# Patient Record
Sex: Female | Born: 1945 | Race: White | Hispanic: No | Marital: Married | Smoking: Never smoker
Health system: Southern US, Community
[De-identification: ages and names within clinical notes are randomized; demographics above are authoritative.]

## PROBLEM LIST (undated history)

## (undated) DIAGNOSIS — I499 Cardiac arrhythmia, unspecified: Secondary | ICD-10-CM

## (undated) DIAGNOSIS — E785 Hyperlipidemia, unspecified: Secondary | ICD-10-CM

## (undated) DIAGNOSIS — I350 Nonrheumatic aortic (valve) stenosis: Secondary | ICD-10-CM

## (undated) DIAGNOSIS — I351 Nonrheumatic aortic (valve) insufficiency: Secondary | ICD-10-CM

## (undated) DIAGNOSIS — R519 Headache, unspecified: Secondary | ICD-10-CM

## (undated) DIAGNOSIS — J302 Other seasonal allergic rhinitis: Secondary | ICD-10-CM

## (undated) DIAGNOSIS — I359 Nonrheumatic aortic valve disorder, unspecified: Secondary | ICD-10-CM

## (undated) HISTORY — DX: Hyperlipidemia, unspecified: E78.5

## (undated) HISTORY — DX: Nonrheumatic aortic (valve) stenosis: I35.0

## (undated) HISTORY — DX: Nonrheumatic aortic (valve) insufficiency: I35.1

---

## 2011-11-24 ENCOUNTER — Other Ambulatory Visit: Payer: Self-pay | Admitting: Family Medicine

## 2011-11-24 DIAGNOSIS — Z8271 Family history of polycystic kidney: Secondary | ICD-10-CM

## 2011-11-26 ENCOUNTER — Ambulatory Visit
Admission: RE | Admit: 2011-11-26 | Discharge: 2011-11-26 | Disposition: A | Discharge status: Home / Self Care | Payer: Medicare Other | Source: Ambulatory Visit | Attending: Family Medicine | Admitting: Family Medicine

## 2011-11-26 DIAGNOSIS — Z8271 Family history of polycystic kidney: Secondary | ICD-10-CM

## 2012-07-18 ENCOUNTER — Other Ambulatory Visit: Payer: Self-pay | Admitting: Family Medicine

## 2012-07-18 DIAGNOSIS — M8580 Other specified disorders of bone density and structure, unspecified site: Secondary | ICD-10-CM

## 2012-07-18 DIAGNOSIS — Z1231 Encounter for screening mammogram for malignant neoplasm of breast: Secondary | ICD-10-CM

## 2012-08-25 ENCOUNTER — Ambulatory Visit
Admission: RE | Admit: 2012-08-25 | Discharge: 2012-08-25 | Disposition: A | Discharge status: Home / Self Care | Payer: Medicare Other | Source: Ambulatory Visit | Attending: Family Medicine | Admitting: Family Medicine

## 2012-08-25 DIAGNOSIS — Z1231 Encounter for screening mammogram for malignant neoplasm of breast: Secondary | ICD-10-CM

## 2012-08-25 DIAGNOSIS — M8580 Other specified disorders of bone density and structure, unspecified site: Secondary | ICD-10-CM

## 2013-07-22 ENCOUNTER — Encounter (HOSPITAL_COMMUNITY): Payer: Self-pay | Admitting: Emergency Medicine

## 2013-07-22 ENCOUNTER — Emergency Department (HOSPITAL_COMMUNITY): Payer: Medicare Other

## 2013-07-22 ENCOUNTER — Emergency Department (HOSPITAL_COMMUNITY)
Admission: EM | Admit: 2013-07-22 | Discharge: 2013-07-22 | Disposition: A | Discharge status: Home / Self Care | Payer: Medicare Other | Attending: Emergency Medicine | Admitting: Emergency Medicine

## 2013-07-22 DIAGNOSIS — Z79899 Other long term (current) drug therapy: Secondary | ICD-10-CM | POA: Insufficient documentation

## 2013-07-22 DIAGNOSIS — R109 Unspecified abdominal pain: Secondary | ICD-10-CM

## 2013-07-22 DIAGNOSIS — Z8709 Personal history of other diseases of the respiratory system: Secondary | ICD-10-CM | POA: Insufficient documentation

## 2013-07-22 DIAGNOSIS — IMO0002 Reserved for concepts with insufficient information to code with codable children: Secondary | ICD-10-CM | POA: Insufficient documentation

## 2013-07-22 DIAGNOSIS — Z91040 Latex allergy status: Secondary | ICD-10-CM | POA: Insufficient documentation

## 2013-07-22 DIAGNOSIS — N390 Urinary tract infection, site not specified: Secondary | ICD-10-CM

## 2013-07-22 DIAGNOSIS — Z792 Long term (current) use of antibiotics: Secondary | ICD-10-CM | POA: Insufficient documentation

## 2013-07-22 DIAGNOSIS — R1013 Epigastric pain: Secondary | ICD-10-CM | POA: Insufficient documentation

## 2013-07-22 DIAGNOSIS — N281 Cyst of kidney, acquired: Secondary | ICD-10-CM | POA: Insufficient documentation

## 2013-07-22 HISTORY — DX: Other seasonal allergic rhinitis: J30.2

## 2013-07-22 LAB — CBC WITH DIFFERENTIAL/PLATELET
Basophils Absolute: 0 10*3/uL (ref 0.0–0.1)
Basophils Relative: 0 % (ref 0–1)
Eosinophils Absolute: 0.1 10*3/uL (ref 0.0–0.7)
Eosinophils Relative: 1 % (ref 0–5)
HCT: 36.9 % (ref 36.0–46.0)
Lymphocytes Relative: 21 % (ref 12–46)
MCH: 32.8 pg (ref 26.0–34.0)
MCHC: 35.8 g/dL (ref 30.0–36.0)
MCV: 91.8 fL (ref 78.0–100.0)
Monocytes Absolute: 0.6 10*3/uL (ref 0.1–1.0)
Monocytes Relative: 10 % (ref 3–12)
RDW: 12.6 % (ref 11.5–15.5)

## 2013-07-22 LAB — COMPREHENSIVE METABOLIC PANEL
AST: 16 U/L (ref 0–37)
Albumin: 3.8 g/dL (ref 3.5–5.2)
BUN: 12 mg/dL (ref 6–23)
CO2: 28 mEq/L (ref 19–32)
Calcium: 9.7 mg/dL (ref 8.4–10.5)
Creatinine, Ser: 0.89 mg/dL (ref 0.50–1.10)
GFR calc non Af Amer: 66 mL/min — ABNORMAL LOW (ref >90)
Sodium: 138 mEq/L (ref 135–145)

## 2013-07-22 LAB — URINALYSIS, ROUTINE W REFLEX MICROSCOPIC
Bilirubin Urine: NEGATIVE
Glucose, UA: NEGATIVE mg/dL
Ketones, ur: 15 mg/dL — AB
Nitrite: NEGATIVE
Protein, ur: NEGATIVE mg/dL
pH: 7 (ref 5.0–8.0)

## 2013-07-22 LAB — LIPASE, BLOOD: Lipase: 37 U/L (ref 11–59)

## 2013-07-22 LAB — URINE MICROSCOPIC-ADD ON

## 2013-07-22 MED ORDER — IOHEXOL 300 MG/ML  SOLN
100.0000 mL | Freq: Once | INTRAMUSCULAR | Status: AC | PRN
  Administered 2013-07-22: 100 mL via INTRAVENOUS

## 2013-07-22 MED ORDER — IOHEXOL 300 MG/ML  SOLN
25.0000 mL | INTRAMUSCULAR | Status: DC | PRN
  Administered 2013-07-22: 25 mL via ORAL

## 2013-07-22 MED ORDER — CIPROFLOXACIN HCL 500 MG PO TABS
500.0000 mg | ORAL_TABLET | Freq: Once | ORAL | Status: AC
  Administered 2013-07-22: 500 mg via ORAL
  Filled 2013-07-22: qty 1

## 2013-07-22 MED ORDER — HYDROCODONE-ACETAMINOPHEN 5-325 MG PO TABS: 2.0000 | ORAL_TABLET | ORAL | Status: DC | PRN

## 2013-07-22 MED ORDER — LEVOFLOXACIN 500 MG PO TABS: 500.0000 mg | ORAL_TABLET | Freq: Every day | ORAL | Status: DC

## 2013-07-22 MED ORDER — ONDANSETRON 4 MG PO TBDP: 4.0000 mg | ORAL_TABLET | Freq: Three times a day (TID) | ORAL | Status: DC | PRN

## 2013-07-22 NOTE — ED Provider Notes (Signed)
CSN: [id number]     Arrival date & time 07/22/13  1157 History   First MD Initiated Contact with Patient 07/22/13 1205     Chief Complaint  Patient presents with  . Abdominal Pain  . Fever    HPI  Patient presents with one to 2 days of abdominal pain. Symptoms started epigastric and right upper abdomen has a "discomfort" patient is hard time describing it. Not frankly periumbilical. She should not had a great appetite has not been nauseated. She's had normal bowel movements of diarrhea. Normally she avoids onions and spicy foods because they give her reflux. She does not have any fatty food or greasy food intolerance or typical biliary colic symptoms. Her pain was more to the right mid abdomen last night and this morning. [facility] and referred here for further evaluation. She has been eating. He admits her appetite is a bit off but does not have any frank anorexia. Has not had fevers or chills. Not had urinary symptoms. She has had no abdominal surgeries. She's not take anti-inflammatories. She does not smoke. She does not drink. She takes one or 2 cups of coffee per day.  She's had no weakness or fatigue. She's had no night sweats. She's had no weight loss or other B. type symptoms.  Past Medical History  Diagnosis Date  . Seasonal allergies    History reviewed. No pertinent past surgical history. History reviewed. No pertinent family history. History  Substance Use Topics  . Smoking status: Never Smoker   . Smokeless tobacco: Not on file  . Alcohol Use: Yes     Comment: wine occ   OB History   Grav Para Term Preterm Abortions TAB SAB Ect Mult Living                 Review of Systems  Allergies  Sulfa antibiotics; Bactrim; and Latex  Home Medications   Current Outpatient Rx  Name  Route  Sig  Dispense  Refill  . acetaminophen (TYLENOL) 500 MG tablet   Oral   Take 1,000 mg by mouth every 6 (six) hours as needed for pain.         . Ascorbic Acid (VITAMIN C  PO)   Oral   Take 1 tablet by mouth daily.         Marland Kitchen Bioflavonoid Products (GRAPE SEED PO)   Oral   Take 1 tablet by mouth daily.         Marland Kitchen CRANBERRY PO   Oral   Take 300 mg by mouth daily.         . fish oil-omega-3 fatty acids 1000 MG capsule   Oral   Take 1 g by mouth daily.         . fluticasone (FLONASE) 50 MCG/ACT nasal spray   Nasal   Place 2 sprays into the nose daily.         Marland Kitchen loratadine (CLARITIN) 10 MG tablet   Oral   Take 10 mg by mouth daily.         . Multiple Vitamin (MULTIVITAMIN WITH MINERALS) TABS tablet   Oral   Take 1 tablet by mouth daily.         Marland Kitchen HYDROcodone-acetaminophen (NORCO/VICODIN) 5-325 MG per tablet   Oral   Take 2 tablets by mouth every 4 (four) hours as needed for pain.   10 tablet   0   . levofloxacin (LEVAQUIN) 500 MG tablet   Oral   Take  1 tablet (500 mg total) by mouth daily.   10 tablet   0   . ondansetron (ZOFRAN ODT) 4 MG disintegrating tablet   Oral   Take 1 tablet (4 mg total) by mouth every 8 (eight) hours as needed for nausea.   20 tablet   0    BP 109/70  Pulse 80  Temp(Src) 99 F (37.2 C) (Oral)  Resp 16  Ht 5\' 3"  (1.6 m)  Wt 120 lb (54.432 kg)  BMI 21.26 kg/m2  SpO2 100% Physical Exam  Constitutional: She is oriented to person, place, and time. She appears well-developed and well-nourished. No distress.  HENT:  Head: Normocephalic.  Eyes: Conjunctivae are normal. Pupils are equal, round, and reactive to light. No scleral icterus.  Neck: Normal range of motion. Neck supple. No thyromegaly present.  Cardiovascular: Normal rate and regular rhythm.  Exam reveals no gallop and no friction rub.   No murmur heard. Pulmonary/Chest: Effort normal and breath sounds normal. No respiratory distress. She has no wheezes. She has no rales.  Abdominal: Soft. Bowel sounds are normal. She exhibits no distension. There is no tenderness. There is no rebound.  Slight tenderness to deep palpation of the right  mid abdomen. Not frankly tender over McBurney or the right pelvis. Some referred pain to the right midabdomen to palpation of the right upper quadrant. Negative Murphy's sign. No CVA area tenderness. Her exam Is not classic for appendicitis peritonitis or Murphy's tenderness.  Musculoskeletal: Normal range of motion.  Neurological: She is alert and oriented to person, place, and time.  Skin: Skin is warm and dry. No rash noted.  Psychiatric: She has a normal mood and affect. Her behavior is normal.    ED Course  Procedures (including critical care time) Labs Review Labs Reviewed  COMPREHENSIVE METABOLIC PANEL - Abnormal; Notable for the following:    GFR calc non Af Amer 66 (*)    GFR calc Af Amer 76 (*)    All other components within normal limits  URINALYSIS, ROUTINE W REFLEX MICROSCOPIC - Abnormal; Notable for the following:    Ketones, ur 15 (*)    Leukocytes, UA MODERATE (*)    All other components within normal limits  URINE MICROSCOPIC-ADD ON - Abnormal; Notable for the following:    Bacteria, UA MANY (*)    All other components within normal limits  URINE CULTURE  CBC WITH DIFFERENTIAL  LIPASE, BLOOD   Imaging Review Ct Abdomen Pelvis W Contrast  07/22/2013   CLINICAL DATA:  Initial encounter for right-sided abdominal pain that began 2 days ago, associated with fever.  EXAM: CT ABDOMEN AND PELVIS WITH CONTRAST  TECHNIQUE: Multidetector CT imaging of the abdomen and pelvis was performed using the standard protocol following bolus administration of intravenous contrast.  CONTRAST:  OMNIPAQUE IOHEXOL 300 MG/ML IV. Oral contrast was also administered.  COMPARISON:  None.  FINDINGS: Dilation of the common bile duct and common hepatic duct, maximum diameter approximating 11 mm. The duct can be followed to the ampulla where it tapers, and there is suggestion of a filling defect on both the axial and coronal images. No evidence of intrahepatic biliary ductal dilation.   Normal-appearing liver, spleen, pancreas, adrenal glands, and right kidney. Moderate aortoiliofemoral atherosclerosis without aneurysm. No significant lymphadenopathy.  Approximate 1 cm exophytic mass arising from the upper pole of the left kidney laterally which is not a simple cyst, having Hounsfield measurements in the 50-80 range. Second approximate 1 cm vague solid appearing  mass arising from the medial upper pole of the left kidney cyst. Subcentimeter more simple appearing cyst in the mid left kidney.  Normal appearing stomach and small bowel. Large stool burden throughout the tortuous, redundant, but otherwise normal-appearing colon. Normal appendix in the right upper pelvis, filling with the oral contrast material. No ascites.  Atrophic, slightly retroverted uterus containing a calcified fibroid in the posterior uterine body. No adnexal masses or free pelvic fluid. Numerous pelvic phleboliths. Urinary bladder decompressed, but demonstrates asymmetric thickening of the anterior, superior, and inferior walls relative to the posterior wall, associated with enhancement of the mucosa.  Bone window images demonstrate degenerative changes and ankylosis involving the right sacroiliac joint but are otherwise unremarkable. Visualized lung bases clear. Heart size upper normal.  IMPRESSION: 1. Dilation of the extrahepatic bile duct up to 11 mm diameter. The duct can be followed to the ampulla where there may be a small filling defect. 2. Two approximate 1 cm masses arising from the upper pole of the left kidney, 1 of which appears solid in the other of which is indeterminate. 3. Asymmetric thickening of the anterior, superior, and inferior walls of the urinary bladder, associated with enhancement of the bladder mucosa. Cystitis, either infectious or interstitial, is suspected.  In order to better evaluate the findings in the left kidney and in the distal common bile duct, MRI of the abdomen without and with contrast and  MRCP may be helpful in further evaluation. This recommendation follows ACR consensus guidelines: Managing Incidental Findings on Abdominal CT: White Paper of the ACR Incidental Findings Committee. J Am Coll Radiol 2010;7:754-773.   Electronically Signed   By: Hulan Saas M.D.   On: 07/22/2013 15:15    EKG Interpretation   None       MDM   1. Abdominal pain   2. Urinary tract infection   3. Renal cyst      With her pain starting more upper abdomen, then migrating to her right mid abdomen, I have concerns for appendicitis. However, the symptoms have progressed over 3 days, and her exam does not suggest peritonitis that I would expect with a 63-day-old appendicitis. She's not had typical gallbladder symptoms and is more tender in her mid abdomen than in the right subcostal abdomen. Differential diagnosis would include viral syndrome with adenitis, acid related phenomenon-duodenitis duodenal ulcer, appendicitis (atypical). Plan CT scan,urine and lab evaluation, and patient re-evaluation.  Urinalysis does show white blood cells and bacteria. Her hepatobiliary or pancreatic enzymes are normal. Her white blood cell count is also normal. CT scan shows abnormality the urinary latter lumen suggestive of a cystitis. Her CT scan also shows dilatation of her extrahepatic biliary ducts. And an abnormality and arising from the top of her left kidney. I've made aware of all these findings. I sat at the bedside and refer her to in pictures of her liver, bile bites, kidneys, and bladder. I explained all these abnormalities. Recommendation per the radiologist is followup MRI to further delineate the ampulla and bile ducts, and renal abnormalities.  I gave her pretty copies of her CT scan result. I gave her copies of her labs. I typed freehand my instructions for her to take these were primary care physician appointment. She has an appointment tomorrow at her primary care physician [facility]. I asked her to  take these with her to help set up the followup testing. I will start her on antibiotic treatment for her urinary tract infection. I feel confident that she will  followup as istruucted.       Roney Marion, MD 07/22/13 [id number]

## 2013-07-22 NOTE — ED Notes (Signed)
Discharge and follow up instructions reviewed with pt. Pt verbalized understanding.  

## 2013-07-22 NOTE — ED Notes (Signed)
Pt c/o rt sided abd pain that started on Friday. Pt states she also had a fever. Pt states [facility] sent her here for further evaluation, also told her she had a fever. Pt rates pain 6/10. Pt denies any nausea, vomiting, and diarrhea.

## 2013-07-22 NOTE — ED Notes (Signed)
Patient transported to CT 

## 2013-07-22 NOTE — ED Notes (Signed)
Pt reports right side abd pain x 1-2 days, having fever. Went to [facility] and sent here for further eval. Denies any vomiting or diarrhea.

## 2013-07-22 NOTE — ED Notes (Signed)
CT notified pt completed contrast.  

## 2013-07-23 ENCOUNTER — Other Ambulatory Visit: Payer: Self-pay | Admitting: Family Medicine

## 2013-07-23 DIAGNOSIS — R935 Abnormal findings on diagnostic imaging of other abdominal regions, including retroperitoneum: Secondary | ICD-10-CM

## 2013-07-23 DIAGNOSIS — N3289 Other specified disorders of bladder: Secondary | ICD-10-CM

## 2013-07-23 DIAGNOSIS — K838 Other specified diseases of biliary tract: Secondary | ICD-10-CM

## 2013-07-24 LAB — URINE CULTURE: Colony Count: >=100000

## 2013-07-26 NOTE — ED Notes (Signed)
+   Urine  No treatment needed per Tiffany Greene.  

## 2013-07-31 ENCOUNTER — Ambulatory Visit
Admission: RE | Admit: 2013-07-31 | Discharge: 2013-07-31 | Disposition: A | Discharge status: Home / Self Care | Payer: Medicare Other | Source: Ambulatory Visit | Attending: Family Medicine | Admitting: Family Medicine

## 2013-07-31 DIAGNOSIS — N3289 Other specified disorders of bladder: Secondary | ICD-10-CM

## 2013-07-31 DIAGNOSIS — R935 Abnormal findings on diagnostic imaging of other abdominal regions, including retroperitoneum: Secondary | ICD-10-CM

## 2013-07-31 DIAGNOSIS — K838 Other specified diseases of biliary tract: Secondary | ICD-10-CM

## 2013-08-08 ENCOUNTER — Ambulatory Visit
Admission: RE | Admit: 2013-08-08 | Discharge: 2013-08-08 | Disposition: A | Discharge status: Home / Self Care | Payer: Medicare Other | Source: Ambulatory Visit | Attending: Family Medicine | Admitting: Family Medicine

## 2013-08-08 MED ORDER — GADOBENATE DIMEGLUMINE 529 MG/ML IV SOLN
8.0000 mL | Freq: Once | INTRAVENOUS | Status: AC | PRN
  Administered 2013-08-08: 8 mL via INTRAVENOUS

## 2017-06-13 ENCOUNTER — Other Ambulatory Visit: Payer: Self-pay | Admitting: Family Medicine

## 2017-06-13 DIAGNOSIS — Z1231 Encounter for screening mammogram for malignant neoplasm of breast: Secondary | ICD-10-CM

## 2017-06-13 DIAGNOSIS — M8580 Other specified disorders of bone density and structure, unspecified site: Secondary | ICD-10-CM

## 2017-06-14 ENCOUNTER — Ambulatory Visit
Admission: RE | Admit: 2017-06-14 | Discharge: 2017-06-14 | Disposition: A | Discharge status: Home / Self Care | Payer: Medicare Other | Source: Ambulatory Visit | Attending: Family Medicine | Admitting: Family Medicine

## 2017-06-14 ENCOUNTER — Other Ambulatory Visit (HOSPITAL_COMMUNITY): Payer: Self-pay | Admitting: Psychiatry

## 2017-06-14 ENCOUNTER — Other Ambulatory Visit: Payer: Self-pay | Admitting: Family Medicine

## 2017-06-14 DIAGNOSIS — M25561 Pain in right knee: Secondary | ICD-10-CM

## 2017-06-14 DIAGNOSIS — R609 Edema, unspecified: Secondary | ICD-10-CM

## 2017-06-14 DIAGNOSIS — S8991XD Unspecified injury of right lower leg, subsequent encounter: Secondary | ICD-10-CM

## 2017-06-14 DIAGNOSIS — G8929 Other chronic pain: Secondary | ICD-10-CM

## 2017-07-06 ENCOUNTER — Ambulatory Visit
Admission: RE | Admit: 2017-07-06 | Discharge: 2017-07-06 | Disposition: A | Discharge status: Home / Self Care | Payer: Medicare Other | Source: Ambulatory Visit | Attending: Family Medicine | Admitting: Family Medicine

## 2017-07-06 DIAGNOSIS — Z1231 Encounter for screening mammogram for malignant neoplasm of breast: Secondary | ICD-10-CM

## 2017-07-06 DIAGNOSIS — M8580 Other specified disorders of bone density and structure, unspecified site: Secondary | ICD-10-CM

## 2018-03-14 ENCOUNTER — Encounter (HOSPITAL_COMMUNITY): Payer: Self-pay

## 2018-03-14 ENCOUNTER — Ambulatory Visit (HOSPITAL_COMMUNITY): Admit: 2018-03-14 | Payer: Medicare Other | Admitting: Cardiology

## 2018-03-14 SURGERY — ECHOCARDIOGRAM, TRANSESOPHAGEAL
Anesthesia: Moderate Sedation

## 2018-10-10 ENCOUNTER — Other Ambulatory Visit: Payer: Self-pay | Admitting: Cardiology

## 2018-10-10 DIAGNOSIS — I350 Nonrheumatic aortic (valve) stenosis: Secondary | ICD-10-CM

## 2018-10-23 ENCOUNTER — Other Ambulatory Visit (HOSPITAL_COMMUNITY): Payer: Self-pay | Admitting: Cardiology

## 2018-10-24 LAB — LIPID PANEL W/O CHOL/HDL RATIO
Cholesterol, Total: 173 mg/dL (ref 100–199)
HDL: 73 mg/dL (ref >39)
LDL CALC: 88 mg/dL (ref 0–99)
TRIGLYCERIDES: 62 mg/dL (ref 0–149)
VLDL CHOLESTEROL CAL: 12 mg/dL (ref 5–40)

## 2018-10-25 ENCOUNTER — Ambulatory Visit: Payer: Medicare Other

## 2018-10-25 DIAGNOSIS — I350 Nonrheumatic aortic (valve) stenosis: Secondary | ICD-10-CM

## 2018-11-03 ENCOUNTER — Ambulatory Visit (INDEPENDENT_AMBULATORY_CARE_PROVIDER_SITE_OTHER): Payer: Medicare Other | Admitting: Cardiology

## 2018-11-03 ENCOUNTER — Encounter: Payer: Self-pay | Admitting: Cardiology

## 2018-11-03 VITALS — BP 103/45 | HR 67 | Ht 63.0 in | Wt 126.9 lb

## 2018-11-03 DIAGNOSIS — I351 Nonrheumatic aortic (valve) insufficiency: Secondary | ICD-10-CM

## 2018-11-03 DIAGNOSIS — I350 Nonrheumatic aortic (valve) stenosis: Secondary | ICD-10-CM | POA: Diagnosis not present

## 2018-11-03 DIAGNOSIS — E782 Mixed hyperlipidemia: Secondary | ICD-10-CM

## 2018-11-03 NOTE — Progress Notes (Signed)
Patient is here for follow up visit.  Subjective:   @Patient  ID: Kayla Ward, female    DOB: 1946/03/22, 73 y.o.   MRN: [id number]  Chief Complaint  Patient presents with  . Aortic Insuffiency    HPI  73 year old Caucasian female with hyperlipidemia, moderate aortic stenosis and moderate aortic regurgitation.  Patient is doing better than a year ago in terms of exertional dyspnea. Echocardiogram in 10/2018 showed normal EF, moderate aortic regurgitation with moderate aortic stenosis, DVI of 0.30.  She has had only occasional episodes of lightheadedness, denies any syncope. Lipid panel in 10/2018 has showed significant improvement.   Past Medical History:  Diagnosis Date  . Aortic regurgitation   . AS (aortic stenosis)   . Hyperlipidemia   . Seasonal allergies     History reviewed. No pertinent surgical history.  Social History   Socioeconomic History  . Marital status: Married    Spouse name: Not on file  . Number of children: 2  . Years of education: Not on file  . Highest education level: Not on file  Occupational History  . Not on file  Social Needs  . Financial resource strain: Not on file  . Food insecurity:    Worry: Not on file    Inability: Not on file  . Transportation needs:    Medical: Not on file    Non-medical: Not on file  Tobacco Use  . Smoking status: Never Smoker  . Smokeless tobacco: Never Used  Substance and Sexual Activity  . Alcohol use: Yes    Comment: wine occ  . Drug use: No  . Sexual activity: Not on file  Lifestyle  . Physical activity:    Days per week: Not on file    Minutes per session: Not on file  . Stress: Not on file  Relationships  . Social connections:    Talks on phone: Not on file    Gets together: Not on file    Attends religious service: Not on file    Active member of club or organization: Not on file    Attends meetings of clubs or organizations: Not on file    Relationship status: Not on file  .  Intimate partner violence:    Fear of current or ex partner: Not on file    Emotionally abused: Not on file    Physically abused: Not on file    Forced sexual activity: Not on file  Other Topics Concern  . Not on file  Social History Narrative  . Not on file    Current Outpatient Medications on File Prior to Visit  Medication Sig Dispense Refill  . acetaminophen (TYLENOL) 500 MG tablet Take 1,000 mg by mouth every 6 (six) hours as needed for pain.    . Ascorbic Acid (VITAMIN C PO) Take 2,000 mg by mouth daily.     Marland Kitchen Bioflavonoid Products (GRAPE SEED PO) Take 400 mg by mouth daily.     . calcium acetate (PHOSLO) 667 MG capsule Take 1,300 mg by mouth daily.    . cetirizine (ZYRTEC) 10 MG tablet Take 10 mg by mouth continuous as needed for allergies.    Marland Kitchen diclofenac sodium (VOLTAREN) 1 % GEL Apply topically continuous as needed.    . fluticasone (FLONASE) 50 MCG/ACT nasal spray Place 2 sprays into the nose daily.    Marland Kitchen GLUCOSAMINE-CALCIUM-VIT D PO Take 1 tablet by mouth daily.    . Multiple Vitamin (MULTIVITAMIN WITH MINERALS) TABS tablet Take 1  tablet by mouth daily.    . ondansetron (ZOFRAN ODT) 4 MG disintegrating tablet Take 1 tablet (4 mg total) by mouth every 8 (eight) hours as needed for nausea. 20 tablet 0  . rosuvastatin (CRESTOR) 10 MG tablet Take 10 mg by mouth daily.    Marland Kitchen. CRANBERRY PO Take 300 mg by mouth daily.    . fish oil-omega-3 fatty acids 1000 MG capsule Take 1 g by mouth daily.    Marland Kitchen. HYDROcodone-acetaminophen (NORCO/VICODIN) 5-325 MG per tablet Take 2 tablets by mouth every 4 (four) hours as needed for pain. (Patient not taking: Reported on 11/03/2018) 10 tablet 0  . levofloxacin (LEVAQUIN) 500 MG tablet Take 1 tablet (500 mg total) by mouth daily. (Patient not taking: Reported on 11/03/2018) 10 tablet 0  . loratadine (CLARITIN) 10 MG tablet Take 10 mg by mouth daily.     No current facility-administered medications on file prior to visit.     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  Echocardiogram 10/25/2018: Left ventricle cavity is normal in size. Normal left ventricular shape. Normal global wall motion. Doppler evidence of grade I (impaired) diastolic dysfunction, normal LAP. Calculated EF 55%. Left atrial cavity is normal in size. 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 Mild calcification of the aortic valve annulus and leaflets. Moderate (Grade III) aortic regurgitation. Moderate aortic stenosis. Aortic valve mean gradient of 18 mmHg, Vmax of 2.7  m/s. Calculated aortic valve area by continuity equation is 1 cm. Dimensionless index 0.30 Mild to moderate tricuspid regurgitation. No evidence of pulmonary hypertension. Mo significant change compared to previous study dated 12/15/2017.    Results for Kayla CreaseBERKEY, Kayla (MRN [id number]) as of 11/03/2018 10:24  Ref. Range 10/23/2018 08:58/ 12/07/2017  Cholesterol, Total Latest Ref Range: 100 - 199 mg/dL 409173 / 811226  HDL Cholesterol Latest Ref Range: >39 mg/dL 73 / 75  LDL (calc) Latest Ref Range: 0 - 99 mg/dL 88 / 914151  Triglycerides Latest Ref Range: 0 - 149 mg/dL 62 / 782128  VLDL Cholesterol Cal Latest Ref Range: 5 - 40 mg/dL 12      Review of Systems  Constitution: Positive for malaise/fatigue. Negative for decreased appetite, weight gain and weight loss.  HENT: Negative for congestion.   Eyes: Negative for visual disturbance.  Cardiovascular: Positive for dyspnea on exertion (Improved compared to 2019.). Negative for chest pain, leg swelling, palpitations and syncope.  Respiratory: Negative for shortness of breath.   Endocrine: Negative for cold intolerance.  Hematologic/Lymphatic: Does not bruise/bleed easily.  Skin: Negative for itching and rash.  Musculoskeletal: Negative for myalgias.  Gastrointestinal: Negative for abdominal pain, nausea and vomiting.  Genitourinary: Negative for  dysuria.  Neurological: Negative for dizziness and weakness.  Psychiatric/Behavioral: The patient is not nervous/anxious.   All other systems reviewed and are negative.      Objective:   Vitals:   11/03/18 1012  BP: (!) 103/45  Pulse: 67  SpO2: 98%     Physical Exam  Constitutional: She is oriented to person, place, and time. She appears well-developed and well-nourished. No distress.  HENT:  Head: Normocephalic and atraumatic.  Eyes: Pupils are equal, round, and reactive to light. Conjunctivae are normal.  Neck: No JVD present.  Cardiovascular: Normal rate, regular rhythm and intact distal pulses.  Murmur (II/VI early systolic and diastolic murmur RUSB) heard. Pulmonary/Chest: Effort normal and breath sounds normal. She has no wheezes. She has no rales.  Abdominal: Soft. Bowel sounds are normal. There is no rebound.  Musculoskeletal:        General: No edema.  Lymphadenopathy:    She has no cervical adenopathy.  Neurological: She is alert and oriented to person, place, and time. No cranial nerve deficit.  Skin: Skin is warm and dry.  Psychiatric: She has a normal mood and affect.  Nursing note and vitals reviewed.       Assessment & Recommendations:   73 year old Caucasian female with hyperlipidemia, moderate aortic stenosis and moderate aortic regurgitation.  1. Moderate aortic regurgitation and stenosis: While the valve is structurally normal, there is moderate aortic regurgitation, thus causing relative increase in aortic valve velocities. Overall, her valvular findings still do not account for her dyspnea symptoms, which has improved. Recommend repeat echocardiogram and clinical follow up in 1 year.   2. Mixed hyperlipidemia Improved on statin. Continue the same.    Elder Negus, MD [facility] Pager: [phone] Office: [phone] If no answer Cell [phone]

## 2018-11-04 ENCOUNTER — Encounter: Payer: Self-pay | Admitting: Cardiology

## 2018-12-21 ENCOUNTER — Other Ambulatory Visit: Payer: Self-pay

## 2018-12-21 DIAGNOSIS — E782 Mixed hyperlipidemia: Secondary | ICD-10-CM

## 2018-12-21 MED ORDER — ROSUVASTATIN CALCIUM 10 MG PO TABS: 10.0000 mg | ORAL_TABLET | Freq: Every day | ORAL | 1 refills | Status: DC

## 2018-12-22 ENCOUNTER — Other Ambulatory Visit: Payer: Self-pay

## 2018-12-22 DIAGNOSIS — E782 Mixed hyperlipidemia: Secondary | ICD-10-CM

## 2018-12-22 MED ORDER — ROSUVASTATIN CALCIUM 10 MG PO TABS: 10.0000 mg | ORAL_TABLET | Freq: Every day | ORAL | 1 refills | Status: DC

## 2019-03-28 ENCOUNTER — Encounter (HOSPITAL_COMMUNITY): Payer: Self-pay

## 2019-03-28 ENCOUNTER — Emergency Department (HOSPITAL_COMMUNITY): Payer: Medicare Other

## 2019-03-28 ENCOUNTER — Emergency Department (HOSPITAL_COMMUNITY)
Admission: EM | Admit: 2019-03-28 | Discharge: 2019-03-28 | Disposition: A | Discharge status: Home / Self Care | Payer: Medicare Other | Attending: Emergency Medicine | Admitting: Emergency Medicine

## 2019-03-28 ENCOUNTER — Other Ambulatory Visit: Payer: Self-pay

## 2019-03-28 DIAGNOSIS — R55 Syncope and collapse: Secondary | ICD-10-CM | POA: Diagnosis present

## 2019-03-28 DIAGNOSIS — Z91040 Latex allergy status: Secondary | ICD-10-CM | POA: Diagnosis not present

## 2019-03-28 DIAGNOSIS — N390 Urinary tract infection, site not specified: Secondary | ICD-10-CM | POA: Diagnosis not present

## 2019-03-28 DIAGNOSIS — Z79899 Other long term (current) drug therapy: Secondary | ICD-10-CM | POA: Insufficient documentation

## 2019-03-28 LAB — CBC WITH DIFFERENTIAL/PLATELET
Abs Immature Granulocytes: 0.02 10*3/uL (ref 0.00–0.07)
Basophils Absolute: 0 10*3/uL (ref 0.0–0.1)
Basophils Relative: 0 %
Eosinophils Absolute: 0.1 10*3/uL (ref 0.0–0.5)
Eosinophils Relative: 1 %
HCT: 40.4 % (ref 36.0–46.0)
Hemoglobin: 13.8 g/dL (ref 12.0–15.0)
Immature Granulocytes: 0 %
Lymphocytes Relative: 10 %
Lymphs Abs: 0.7 10*3/uL (ref 0.7–4.0)
MCH: 31.9 pg (ref 26.0–34.0)
MCHC: 34.2 g/dL (ref 30.0–36.0)
MCV: 93.5 fL (ref 80.0–100.0)
Monocytes Absolute: 0.4 10*3/uL (ref 0.1–1.0)
Monocytes Relative: 6 %
Neutro Abs: 5.9 10*3/uL (ref 1.7–7.7)
Neutrophils Relative %: 83 %
Platelets: 182 10*3/uL (ref 150–400)
RBC: 4.32 MIL/uL (ref 3.87–5.11)
RDW: 12.2 % (ref 11.5–15.5)
WBC: 7.1 10*3/uL (ref 4.0–10.5)
nRBC: 0 % (ref 0.0–0.2)

## 2019-03-28 LAB — URINALYSIS, ROUTINE W REFLEX MICROSCOPIC
Bilirubin Urine: NEGATIVE
Glucose, UA: NEGATIVE mg/dL
Hgb urine dipstick: NEGATIVE
Ketones, ur: NEGATIVE mg/dL
Nitrite: NEGATIVE
Protein, ur: NEGATIVE mg/dL
Specific Gravity, Urine: 1.008 (ref 1.005–1.030)
WBC, UA: >50 WBC/hpf — ABNORMAL HIGH (ref 0–5)
pH: 9 — ABNORMAL HIGH (ref 5.0–8.0)

## 2019-03-28 LAB — BASIC METABOLIC PANEL
Anion gap: 11 (ref 5–15)
BUN: 7 mg/dL — ABNORMAL LOW (ref 8–23)
CO2: 27 mmol/L (ref 22–32)
Calcium: 9.8 mg/dL (ref 8.9–10.3)
Chloride: 104 mmol/L (ref 98–111)
Creatinine, Ser: 0.81 mg/dL (ref 0.44–1.00)
GFR calc Af Amer: >60 mL/min (ref >60)
GFR calc non Af Amer: >60 mL/min (ref >60)
Glucose, Bld: 100 mg/dL — ABNORMAL HIGH (ref 70–99)
Potassium: 4 mmol/L (ref 3.5–5.1)
Sodium: 142 mmol/L (ref 135–145)

## 2019-03-28 LAB — CBG MONITORING, ED: Glucose-Capillary: 87 mg/dL (ref 70–99)

## 2019-03-28 MED ORDER — SODIUM CHLORIDE 0.9 % IV BOLUS
500.0000 mL | Freq: Once | INTRAVENOUS | Status: AC
  Administered 2019-03-28: 500 mL via INTRAVENOUS

## 2019-03-28 MED ORDER — SODIUM CHLORIDE 0.9 % IV SOLN
1.0000 g | Freq: Once | INTRAVENOUS | Status: AC
  Administered 2019-03-28: 1 g via INTRAVENOUS
  Filled 2019-03-28: qty 10

## 2019-03-28 MED ORDER — CEPHALEXIN 500 MG PO CAPS: 500.0000 mg | ORAL_CAPSULE | Freq: Two times a day (BID) | ORAL | 0 refills | Status: AC

## 2019-03-28 NOTE — Discharge Instructions (Signed)
Today's evaluation was assuring. If symptoms begin to worsen please come back for a reevaluation. Patient is also being discharged with medications. Please take as directed.

## 2019-03-28 NOTE — ED Triage Notes (Signed)
Pt woke up & went into the bathroom, after using the restroom she had a syncable episode and did hit her head on the tile floor, her husband found her and called 911. Pt stated to wake up on the floor & upon arrival her only complaint is a slight headache in her forehead (per pt). A bump is felt from floor impact to the Lower-Rt posterior area of her head, A/Ox4.

## 2019-03-28 NOTE — ED Notes (Signed)
Pt is NSR on monitor 

## 2019-03-28 NOTE — ED Provider Notes (Signed)
[facility]   CSN: [id number] Arrival date & time: 03/28/19  [id number]     History   Chief Complaint Chief Complaint  Patient presents with  . Syncable Episode    HPI Kayla Ward is a 73 y.o. female.     Kayla Ward is a 73 y/o female who presents to the ED with a syncopal episode. The event happened this morning after Kayla Ward used the bathroom and washed her hands. Kayla Ward states that she does not remember the incident, but did feel nauseous and dizzy before her fall. She has a headache that radiates over the right occipital and temporal regions that is dull in character. The patient has complained of a severe headache last Monday (03/19/2019) for which she noticed color vision changes, photophobia, and large amounts of vomitus. She was also diagnosed with aortic stenosis and regurgitation  She also states that she has had increased urinary frequency and dysuria this week and has been waking up 3-4 times a night to use the bathroom. She does not complain of myalgias, fever, or abdominal pain.      Past Medical History:  Diagnosis Date  . Aortic regurgitation   . AS (aortic stenosis)   . Hyperlipidemia   . Seasonal allergies     There are no active problems to display for this patient.   History reviewed. No pertinent surgical history.   OB History   No obstetric history on file.      Home Medications    Prior to Admission medications   Medication Sig Start Date End Date Taking? Authorizing Provider  acetaminophen (TYLENOL) 500 MG tablet Take 1,000 mg by mouth every 6 (six) hours as needed for pain.    [provider]  Ascorbic Acid (VITAMIN C PO) Take 2,000 mg by mouth daily.     [provider]  Bioflavonoid Products (GRAPE SEED PO) Take 400 mg by mouth daily.     [provider]  calcium acetate (PHOSLO) 667 MG capsule Take 1,300 mg by mouth daily.    [provider]   carboxymethylcellulose (REFRESH PLUS) 0.5 % SOLN 1 drop daily as needed.    [provider]  cetirizine (ZYRTEC) 10 MG tablet Take 10 mg by mouth continuous as needed for allergies.    [provider]  CRANBERRY PO Take 300 mg by mouth daily.    [provider]  diclofenac sodium (VOLTAREN) 1 % GEL Apply topically continuous as needed.    [provider]  fish oil-omega-3 fatty acids 1000 MG capsule Take 1 g by mouth daily.    [provider]  fluticasone (FLONASE) 50 MCG/ACT nasal spray Place 2 sprays into the nose daily.    [provider]  GLUCOSAMINE-CALCIUM-VIT D PO Take 1 tablet by mouth daily.    [provider]  HYDROcodone-acetaminophen (NORCO/VICODIN) 5-325 MG per tablet Take 2 tablets by mouth every 4 (four) hours as needed for pain. Patient not taking: Reported on 11/03/2018 07/22/13   Rolland PorterJames, Mark, MD  levofloxacin (LEVAQUIN) 500 MG tablet Take 1 tablet (500 mg total) by mouth daily. Patient not taking: Reported on 11/03/2018 07/22/13   Rolland PorterJames, Mark, MD  loratadine (CLARITIN) 10 MG tablet Take 10 mg by mouth daily.    [provider]  Multiple Vitamin (MULTIVITAMIN WITH MINERALS) TABS tablet Take 1 tablet by mouth daily.    [provider]  ondansetron (ZOFRAN ODT) 4 MG disintegrating tablet Take 1 tablet (  4 mg total) by mouth every 8 (eight) hours as needed for nausea. 07/22/13   Rolland PorterJames, Mark, MD  rosuvastatin (CRESTOR) 10 MG tablet Take 1 tablet (10 mg total) by mouth daily. 12/22/18   Patwardhan, Anabel BeneManish J, MD    Family History History reviewed. No pertinent family history.  Social History Social History   Tobacco Use  . Smoking status: Never Smoker  . Smokeless tobacco: Never Used  Substance Use Topics  . Alcohol use: Yes    Comment: wine occ  . Drug use: No     Allergies   Bactrim [sulfamethoxazole-trimethoprim], Sulfa antibiotics, and Latex   Review of Systems Review of Systems   Constitutional: Negative for chills and fever.  HENT: Positive for rhinorrhea. Negative for ear pain and sore throat.   Eyes: Positive for photophobia. Negative for pain and visual disturbance.  Respiratory: Negative for cough, shortness of breath and stridor.   Cardiovascular: Negative for chest pain and palpitations.  Gastrointestinal: Negative for abdominal pain and vomiting.  Genitourinary: Positive for dysuria, frequency and urgency. Negative for hematuria.  Musculoskeletal: Negative for arthralgias and back pain.  Skin: Negative for color change and rash.  Neurological: Positive for dizziness and headaches. Negative for seizures and syncope.       Severe headache last week with vision changes, photophobia, and vomiting.   All other systems reviewed and are negative.    Physical Exam Updated Vital Signs BP (!) 184/74 (BP Location: Right Arm)   Pulse 71   Temp 98 F (36.7 C) (Oral)   Resp 16   Ht 5\' 3"  (1.6 m)   Wt 53.5 kg   SpO2 100%   BMI 20.90 kg/m   Physical Exam Vitals signs and nursing note reviewed.  Constitutional:      General: She is not in acute distress.    Appearance: Normal appearance. She is well-developed.  HENT:     Head: Normocephalic and atraumatic.  Eyes:     Extraocular Movements: Extraocular movements intact.     Conjunctiva/sclera: Conjunctivae normal.     Pupils: Pupils are equal, round, and reactive to light.  Neck:     Musculoskeletal: Neck supple.     Vascular: No carotid bruit.  Cardiovascular:     Rate and Rhythm: Normal rate and regular rhythm.  Pulmonary:     Effort: Pulmonary effort is normal. No respiratory distress.     Breath sounds: Normal breath sounds. No stridor. No wheezing or rhonchi.  Abdominal:     Palpations: Abdomen is soft.     Tenderness: There is no abdominal tenderness.  Skin:    General: Skin is warm and dry.     Coloration: Skin is not jaundiced or pale.  Neurological:     Mental Status: She is alert.       ED Treatments / Results  Labs (all labs ordered are listed, but only abnormal results are displayed) Labs Reviewed  URINALYSIS, ROUTINE W REFLEX MICROSCOPIC  CBC WITH DIFFERENTIAL/PLATELET  BASIC METABOLIC PANEL  CBG MONITORING, ED    EKG None  Radiology No results found.  Procedures Procedures (including critical care time)  Medications Ordered in ED Medications  sodium chloride 0.9 % bolus 500 mL (has no administration in time range)     Initial Impression / Assessment and Plan / ED Course  I have reviewed the triage vital signs and the nursing notes.  Pertinent labs & imaging results that were available during my care of the patient were reviewed by me  and considered in my medical decision making (see chart for details).   Assessment:  Kayla Ward is a 73 y/o female that presents to the ED with  syncopal and collapse.   Plan:  - CT Head W or Wo contrast: r/o bleed - BMP: r/o electrolyte abnormalities and assess renal function - Urinalysis, with reflex microscopic: r/o UTI - EKG: r/o arrhythmia - CBG: r/o hypoglycemia - CBC with differential: r/o infection  - Rocephin 1g IV: Treatment for UTI per Urinalysis and History      Final Clinical Impressions(s) / ED Diagnoses   Final diagnoses:  None    ED Discharge Orders    None       Maudie Mercury, MD 03/28/19 1241    Carmin Muskrat, MD 03/28/19 1259

## 2019-06-18 ENCOUNTER — Other Ambulatory Visit: Payer: Self-pay

## 2019-06-18 DIAGNOSIS — E782 Mixed hyperlipidemia: Secondary | ICD-10-CM

## 2019-06-18 MED ORDER — ROSUVASTATIN CALCIUM 10 MG PO TABS: 10.0000 mg | ORAL_TABLET | Freq: Every day | ORAL | 1 refills | Status: DC

## 2019-10-17 ENCOUNTER — Ambulatory Visit (INDEPENDENT_AMBULATORY_CARE_PROVIDER_SITE_OTHER): Payer: Medicare Other

## 2019-10-17 ENCOUNTER — Other Ambulatory Visit: Payer: Self-pay

## 2019-10-17 DIAGNOSIS — I351 Nonrheumatic aortic (valve) insufficiency: Secondary | ICD-10-CM | POA: Diagnosis not present

## 2019-10-24 ENCOUNTER — Other Ambulatory Visit: Payer: Medicare Other

## 2019-10-31 ENCOUNTER — Other Ambulatory Visit: Payer: Self-pay

## 2019-10-31 ENCOUNTER — Encounter: Payer: Self-pay | Admitting: Cardiology

## 2019-10-31 ENCOUNTER — Ambulatory Visit (INDEPENDENT_AMBULATORY_CARE_PROVIDER_SITE_OTHER): Payer: Medicare Other | Admitting: Cardiology

## 2019-10-31 VITALS — BP 114/76 | HR 78 | Temp 97.7°F | Ht 63.0 in | Wt 127.9 lb

## 2019-10-31 DIAGNOSIS — E782 Mixed hyperlipidemia: Secondary | ICD-10-CM | POA: Insufficient documentation

## 2019-10-31 DIAGNOSIS — I351 Nonrheumatic aortic (valve) insufficiency: Secondary | ICD-10-CM | POA: Insufficient documentation

## 2019-10-31 NOTE — Progress Notes (Signed)
Patient is here for follow up visit.  Subjective:   _0  ID: Lequita Halt, female    DOB: [birth year], 74 y.o.   MRN: [id number]  Chief Complaint  Patient presents with  . Aortic Stenosis  . Follow-up    1 year  . Results    echo    HPI  74 year old Caucasian female with hyperlipidemia, moderate aortic stenosis and moderate aortic regurgitation.  She is doing well. She has occasional episodes of exertional dyspnea, typically when she has sinus congestion. On other days, she does not have overt dyspnea. 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   In summer of 2020, she had an episode of UTI, along with any episode of syncope that was attributed to dehydration and infection. She has not had any recurrent episodes of this. She had CT head at that time, which was reportedly unremarkable.   Current Outpatient Medications on File Prior to Visit  Medication Sig Dispense Refill  . acetaminophen (TYLENOL) 500 MG tablet Take 1,000 mg by mouth every 6 (six) hours as needed for pain.    . Ascorbic Acid (VITAMIN C PO) Take 2,000 mg by mouth daily.     . calcium carbonate (OS-CAL) 1250 (500 Ca) MG chewable tablet Chew 1 tablet by mouth daily.    . cetirizine (ZYRTEC) 10 MG tablet Take 10 mg by mouth daily.     . diclofenac sodium (VOLTAREN) 1 % GEL Apply 2 g topically daily as needed (pain).     . fluticasone (FLONASE) 50 MCG/ACT nasal spray Place 2 sprays into the nose daily.    Marland Kitchen GLUCOSAMINE-CALCIUM-VIT D PO Take 1 tablet by mouth daily.    . Multiple Vitamin (MULTIVITAMIN WITH MINERALS) TABS tablet Take 1 tablet by mouth daily.    . rosuvastatin (CRESTOR) 10 MG tablet Take 1 tablet (10 mg total) by mouth daily. 90 tablet 1   No current facility-administered medications on file prior to visit.    Cardiovascular studies:  EKG 10/31/2019: Sinus rhythm 74 bpm. Normal EKG.  Echocardiogram 10/17/2019:  Normal LV systolic function with EF 59%. Left  ventricle cavity is normal  in size. Normal global wall motion. Normal diastolic filling pattern.  Moderate aortic valve leaflet thickening with moderate calcification.  Trileaflet aortic valve. Moderate aortic stenosis. Moderate (Grade III)  aortic regurgitation. Gradients elevated due to aortic regurgitation. AVA  (VTI) measures 0.9 cm^2. AV Mean Grad measures 20.6 mmHg. AV Pk Vel  measures 3 m/s.  Structurally normal mitral valve. Mild (Grade I) mitral regurgitation.  Mild to moderate tricuspid regurgitation. No evidence of pulmonary  hypertension.  No significant change from 10/25/2018.  EKG 11/03/2018: Sinus  Rhythm 63 bpm Possible old anteriot infarct.  Recent labs: 03/27/2020: Glucose 100, BUN/Cr 7/0.81. EGFR >60. Na/K 142/4.0.  H/H 13.8/40.4. MCV 93. Platelets 182  10/23/2018: Chol 173, TG 62, HDL 73, LDL 88   Review of Systems  Cardiovascular: Positive for dyspnea on exertion (Occasional) and syncope (In 03/2019). Negative for chest pain, leg swelling and palpitations.  Gastrointestinal: Negative for abdominal pain, nausea and vomiting.  Genitourinary: Positive for dysuria (In 03/2019).  All other systems reviewed and are negative.      Objective:   Vitals:   10/31/19 1004  BP: 114/76  Pulse: 78  Temp: 97.7 F (36.5 C)  SpO2: 99%     Physical Exam  HENT:  Head: Atraumatic.  Cardiovascular: Normal rate, regular rhythm and intact distal pulses.  Murmur (II/VI early  systolic and diastolic murmur RUSB) heard. Pulmonary/Chest: Effort normal and breath sounds normal. She has no wheezes. She has no rales.  Nursing note and vitals reviewed.       Assessment & Recommendations:   74 year old Caucasian female with hyperlipidemia, moderate aortic stenosis and moderate aortic regurgitation.  1. Moderate aortic regurgitation and stenosis: While the valve is structurally normal, there is moderate aortic regurgitation, thus causing relative increase in aortic valve  velocities. Overall, findings do not suggest severe stenosis or regurgitation. I do not think her syncope was related to her valvular heart disease. Repeat echocardiogram in 1 year  2. Mixed hyperlipidemia Improved on statin. Continue the same.  Check lipid panel now and repeat in 1 in year  F/u in 1 year   Nigel Mormon, MD [facility] [place] Pager: [phone] Office: [phone] If no answer Cell [phone]

## 2019-11-07 LAB — LIPID PANEL
Chol/HDL Ratio: 2.4 ratio (ref 0.0–4.4)
Cholesterol, Total: 169 mg/dL (ref 100–199)
HDL: 71 mg/dL (ref >39)
LDL Chol Calc (NIH): 83 mg/dL (ref 0–99)
Triglycerides: 80 mg/dL (ref 0–149)
VLDL Cholesterol Cal: 15 mg/dL (ref 5–40)

## 2019-11-07 NOTE — Progress Notes (Signed)
Pt aware of results.//ah

## 2019-12-17 ENCOUNTER — Other Ambulatory Visit: Payer: Self-pay

## 2019-12-17 DIAGNOSIS — E782 Mixed hyperlipidemia: Secondary | ICD-10-CM

## 2019-12-17 MED ORDER — ROSUVASTATIN CALCIUM 10 MG PO TABS: 10.0000 mg | ORAL_TABLET | Freq: Every day | ORAL | 1 refills | Status: DC

## 2020-03-18 ENCOUNTER — Other Ambulatory Visit: Payer: Self-pay | Admitting: Family Medicine

## 2020-03-18 DIAGNOSIS — Z1231 Encounter for screening mammogram for malignant neoplasm of breast: Secondary | ICD-10-CM

## 2020-03-18 DIAGNOSIS — M85852 Other specified disorders of bone density and structure, left thigh: Secondary | ICD-10-CM

## 2020-06-12 ENCOUNTER — Ambulatory Visit
Admission: RE | Admit: 2020-06-12 | Discharge: 2020-06-12 | Disposition: A | Discharge status: Home / Self Care | Payer: Medicare Other | Source: Ambulatory Visit | Attending: Family Medicine | Admitting: Family Medicine

## 2020-06-12 ENCOUNTER — Other Ambulatory Visit: Payer: Self-pay

## 2020-06-12 DIAGNOSIS — Z1231 Encounter for screening mammogram for malignant neoplasm of breast: Secondary | ICD-10-CM

## 2020-06-12 DIAGNOSIS — M85852 Other specified disorders of bone density and structure, left thigh: Secondary | ICD-10-CM

## 2020-06-17 ENCOUNTER — Other Ambulatory Visit: Payer: Self-pay | Admitting: Cardiology

## 2020-06-17 DIAGNOSIS — E782 Mixed hyperlipidemia: Secondary | ICD-10-CM

## 2020-10-03 LAB — LIPID PANEL
Chol/HDL Ratio: 2.1 ratio (ref 0.0–4.4)
Cholesterol, Total: 164 mg/dL (ref 100–199)
HDL: 77 mg/dL (ref >39)
LDL Chol Calc (NIH): 78 mg/dL (ref 0–99)
Triglycerides: 43 mg/dL (ref 0–149)
VLDL Cholesterol Cal: 9 mg/dL (ref 5–40)

## 2020-10-30 ENCOUNTER — Other Ambulatory Visit: Payer: Self-pay

## 2020-10-30 ENCOUNTER — Ambulatory Visit: Payer: Medicare Other

## 2020-10-30 DIAGNOSIS — I351 Nonrheumatic aortic (valve) insufficiency: Secondary | ICD-10-CM

## 2020-11-06 ENCOUNTER — Encounter: Payer: Self-pay | Admitting: Cardiology

## 2020-11-06 ENCOUNTER — Ambulatory Visit: Payer: Medicare Other | Admitting: Cardiology

## 2020-11-06 ENCOUNTER — Other Ambulatory Visit: Payer: Self-pay

## 2020-11-06 VITALS — BP 126/55 | HR 70 | Temp 98.4°F | Ht 63.0 in | Wt 131.0 lb

## 2020-11-06 DIAGNOSIS — I350 Nonrheumatic aortic (valve) stenosis: Secondary | ICD-10-CM | POA: Insufficient documentation

## 2020-11-06 DIAGNOSIS — E782 Mixed hyperlipidemia: Secondary | ICD-10-CM

## 2020-11-06 DIAGNOSIS — I351 Nonrheumatic aortic (valve) insufficiency: Secondary | ICD-10-CM

## 2020-11-06 DIAGNOSIS — R0989 Other specified symptoms and signs involving the circulatory and respiratory systems: Secondary | ICD-10-CM

## 2020-11-06 NOTE — Progress Notes (Signed)
Patient is here for follow up visit.  Subjective:   '@Patient'  ID: Kayla Ward, female    DOB: December 04, 1945, 75 y.o.   MRN: [id number]   Chief Complaint  Patient presents with  . Nonrheumatic aortic valve insufficiency  . Follow-up    HPI  75 year old Caucasian female with hyperlipidemia, moderate aortic stenosis and moderate aortic regurgitation.  In summer of 2020, she had an episode of UTI, along with any episode of syncope that was attributed to dehydration and infection. She has not had any recurrent episodes of this. She had CT head at that time, which was reportedly unremarkable.   Patient recently moved from one house to another. She walks around [facility] park, couple times, without any chest pain, shortness of breath, presyncope or syncope. She denies orthopnea, PND, leg edema. Sometimes, she feels like a "jolt sensation" only for a second or so/    Current Outpatient Medications on File Prior to Visit  Medication Sig Dispense Refill  . acetaminophen (TYLENOL) 500 MG tablet Take 1,000 mg by mouth every 6 (six) hours as needed for pain.    . Ascorbic Acid (VITAMIN C PO) Take 2,000 mg by mouth daily.     . calcium carbonate (OS-CAL) 1250 (500 Ca) MG chewable tablet Chew 1 tablet by mouth daily.    . cetirizine (ZYRTEC) 10 MG tablet Take 10 mg by mouth daily.     . diclofenac sodium (VOLTAREN) 1 % GEL Apply 2 g topically daily as needed (pain).     . fluticasone (FLONASE) 50 MCG/ACT nasal spray Place 2 sprays into the nose daily.    Marland Kitchen GLUCOSAMINE-CALCIUM-VIT D PO Take 1 tablet by mouth daily.    . Multiple Vitamin (MULTIVITAMIN WITH MINERALS) TABS tablet Take 1 tablet by mouth daily.    . rosuvastatin (CRESTOR) 10 MG tablet TAKE ONE TABLET BY MOUTH DAILY 90 tablet 1   No current facility-administered medications on file prior to visit.    Cardiovascular studies:  EKG 11/06/2020: Sinus rhythm 65 bpm Normal EKG  Echocardiogram 10/30/2020:  Left ventricle cavity is  normal in size and wall thickness. Normal global  wall motion. Normal LV systolic function with EF 55%. Doppler evidence of  grade I (impaired) diastolic dysfunction, normal LAP.  Left atrial cavity is mildly dilated.  Trileaflet aortic valve with moderate calcification. Moderate (grade III)  aortic regurgitation. While gradients are likely elevated due to aortic  regurgitation, there is at least moderate aortic stenosis. Vmax 3.3 m/sec,  mean PG 29 mmHg, AVA 0.8 cm2 by continuity equation. Dimensionless index  0.27.  Mild to moderate mitral regurgitation.  Mild tricuspid regurgitation.  No evidence of pulmonary hypertension.  Compared to previous study on 10/17/2019, aortic valve mean PG has  increased from 20 mmHg to 29 mmHg, suggesting progression of aortic  stenosis severity.   EKG 10/31/2019: Sinus rhythm 74 bpm. Normal EKG.  Recent labs: 10/02/2020: Chol 164, TG 43, HDL 77, LDL 78  03/27/2020: Glucose 100, BUN/Cr 7/0.81. EGFR >60. Na/K 142/4.0.  H/H 13.8/40.4. MCV 93. Platelets 182  10/23/2018: Chol 173, TG 62, HDL 73, LDL 88   Review of Systems  Cardiovascular: Negative for chest pain, dyspnea on exertion, leg swelling, palpitations and syncope.  Gastrointestinal: Negative for abdominal pain, nausea and vomiting.  Genitourinary: Positive for dysuria (In 03/2019).  All other systems reviewed and are negative.      Objective:    Vitals:   11/06/20 1334  BP: (!) 126/55  Pulse: 70  Temp:  98.4 F (36.9 C)  SpO2: 98%     Physical Exam Vitals and nursing note reviewed.  HENT:     Head: Atraumatic.  Cardiovascular:     Rate and Rhythm: Normal rate and regular rhythm.     Pulses: Intact distal pulses.     Heart sounds: Murmur heard.   Harsh midsystolic murmur is present with a grade of 2/6 at the upper right sternal border radiating to the neck. High-pitched blowing decrescendo early diastolic murmur is present with a grade of 2/4 at the upper right sternal border  radiating to the apex.   Pulmonary:     Effort: Pulmonary effort is normal.     Breath sounds: Normal breath sounds. No wheezing or rales.         Assessment & Recommendations:   75 year old Caucasian female with hyperlipidemia, moderate aortic stenosis and moderate aortic regurgitation.  Moderate aortic regurgitation and stenosis: There is moderate grade 3. There is progression of aortic stenosis from mild to moderate. She is currently asymptomatic from this. Given the recent change, recommend repeat echocardiogram in 6 months. I have encouraged patient to look for any symptoms such as exertional dyspnea, chest pain, presyncope or syncope, orthopnea, PND, leg edema. I do not think there is any indication for aortic valve placement at this time, but clearly needs close monitoring.  Carotid bruit:  Most likely conducted murmur. Will obtain carotid ultrasound to rule out any significant disease.   Mixed hyperlipidemia Improved on statin. Continue the same.   F/u in 6 months  Kayla Cammarata [name], MD [facility] [place] Pager: [phone] Office: [phone] If no answer Cell [phone]

## 2020-11-19 ENCOUNTER — Other Ambulatory Visit: Payer: Self-pay

## 2020-11-19 ENCOUNTER — Ambulatory Visit: Payer: Medicare Other

## 2020-11-19 ENCOUNTER — Other Ambulatory Visit: Payer: Medicare Other

## 2020-11-19 DIAGNOSIS — R0989 Other specified symptoms and signs involving the circulatory and respiratory systems: Secondary | ICD-10-CM

## 2020-11-24 NOTE — Progress Notes (Signed)
Called and spoke with patient regarding her CAD results.

## 2020-12-12 ENCOUNTER — Other Ambulatory Visit: Payer: Self-pay

## 2020-12-12 DIAGNOSIS — E782 Mixed hyperlipidemia: Secondary | ICD-10-CM

## 2020-12-12 MED ORDER — ROSUVASTATIN CALCIUM 10 MG PO TABS: 10.0000 mg | ORAL_TABLET | Freq: Every day | ORAL | 3 refills | Status: DC

## 2021-04-08 ENCOUNTER — Other Ambulatory Visit: Payer: Self-pay

## 2021-04-08 ENCOUNTER — Emergency Department (HOSPITAL_BASED_OUTPATIENT_CLINIC_OR_DEPARTMENT_OTHER)
Admission: EM | Admit: 2021-04-08 | Discharge: 2021-04-08 | Disposition: A | Discharge status: Home / Self Care | Payer: Medicare Other | Attending: Emergency Medicine | Admitting: Emergency Medicine

## 2021-04-08 ENCOUNTER — Encounter (HOSPITAL_BASED_OUTPATIENT_CLINIC_OR_DEPARTMENT_OTHER): Payer: Self-pay

## 2021-04-08 ENCOUNTER — Emergency Department (HOSPITAL_BASED_OUTPATIENT_CLINIC_OR_DEPARTMENT_OTHER): Payer: Medicare Other

## 2021-04-08 DIAGNOSIS — R55 Syncope and collapse: Secondary | ICD-10-CM | POA: Diagnosis present

## 2021-04-08 DIAGNOSIS — R1013 Epigastric pain: Secondary | ICD-10-CM | POA: Diagnosis not present

## 2021-04-08 DIAGNOSIS — Z91040 Latex allergy status: Secondary | ICD-10-CM | POA: Insufficient documentation

## 2021-04-08 DIAGNOSIS — Z20822 Contact with and (suspected) exposure to covid-19: Secondary | ICD-10-CM | POA: Diagnosis not present

## 2021-04-08 DIAGNOSIS — R110 Nausea: Secondary | ICD-10-CM | POA: Diagnosis not present

## 2021-04-08 DIAGNOSIS — R5383 Other fatigue: Secondary | ICD-10-CM | POA: Insufficient documentation

## 2021-04-08 DIAGNOSIS — N3000 Acute cystitis without hematuria: Secondary | ICD-10-CM | POA: Diagnosis not present

## 2021-04-08 DIAGNOSIS — R42 Dizziness and giddiness: Secondary | ICD-10-CM | POA: Insufficient documentation

## 2021-04-08 DIAGNOSIS — R0789 Other chest pain: Secondary | ICD-10-CM | POA: Diagnosis not present

## 2021-04-08 LAB — RESP PANEL BY RT-PCR (FLU A&B, COVID) ARPGX2
Influenza A by PCR: NEGATIVE
Influenza B by PCR: NEGATIVE
SARS Coronavirus 2 by RT PCR: NEGATIVE

## 2021-04-08 LAB — COMPREHENSIVE METABOLIC PANEL
ALT: 10 U/L (ref 0–44)
AST: 15 U/L (ref 15–41)
Albumin: 4.2 g/dL (ref 3.5–5.0)
Alkaline Phosphatase: 52 U/L (ref 38–126)
Anion gap: 7 (ref 5–15)
BUN: 8 mg/dL (ref 8–23)
CO2: 28 mmol/L (ref 22–32)
Calcium: 9.1 mg/dL (ref 8.9–10.3)
Chloride: 104 mmol/L (ref 98–111)
Creatinine, Ser: 0.83 mg/dL (ref 0.44–1.00)
GFR, Estimated: >60 mL/min (ref >60)
Glucose, Bld: 110 mg/dL — ABNORMAL HIGH (ref 70–99)
Potassium: 4.2 mmol/L (ref 3.5–5.1)
Sodium: 139 mmol/L (ref 135–145)
Total Bilirubin: 0.8 mg/dL (ref 0.3–1.2)
Total Protein: 6.7 g/dL (ref 6.5–8.1)

## 2021-04-08 LAB — CBC WITH DIFFERENTIAL/PLATELET
Abs Immature Granulocytes: 0.05 10*3/uL (ref 0.00–0.07)
Basophils Absolute: 0 10*3/uL (ref 0.0–0.1)
Basophils Relative: 0 %
Eosinophils Absolute: 0 10*3/uL (ref 0.0–0.5)
Eosinophils Relative: 0 %
HCT: 37.6 % (ref 36.0–46.0)
Hemoglobin: 12.8 g/dL (ref 12.0–15.0)
Immature Granulocytes: 0 %
Lymphocytes Relative: 8 %
Lymphs Abs: 0.9 10*3/uL (ref 0.7–4.0)
MCH: 31.3 pg (ref 26.0–34.0)
MCHC: 34 g/dL (ref 30.0–36.0)
MCV: 91.9 fL (ref 80.0–100.0)
Monocytes Absolute: 0.9 10*3/uL (ref 0.1–1.0)
Monocytes Relative: 8 %
Neutro Abs: 9.3 10*3/uL — ABNORMAL HIGH (ref 1.7–7.7)
Neutrophils Relative %: 84 %
Platelets: 158 10*3/uL (ref 150–400)
RBC: 4.09 MIL/uL (ref 3.87–5.11)
RDW: 12.6 % (ref 11.5–15.5)
WBC: 11.2 10*3/uL — ABNORMAL HIGH (ref 4.0–10.5)
nRBC: 0 % (ref 0.0–0.2)

## 2021-04-08 LAB — TROPONIN I (HIGH SENSITIVITY)
Troponin I (High Sensitivity): 2 ng/L (ref <18)
Troponin I (High Sensitivity): 3 ng/L (ref <18)

## 2021-04-08 LAB — URINALYSIS, ROUTINE W REFLEX MICROSCOPIC
Bilirubin Urine: NEGATIVE
Glucose, UA: NEGATIVE mg/dL
Hgb urine dipstick: NEGATIVE
Nitrite: NEGATIVE
Protein, ur: NEGATIVE mg/dL
Specific Gravity, Urine: 1.028 (ref 1.005–1.030)
pH: 7.5 (ref 5.0–8.0)

## 2021-04-08 LAB — LIPASE, BLOOD: Lipase: 32 U/L (ref 11–51)

## 2021-04-08 LAB — LACTIC ACID, PLASMA: Lactic Acid, Venous: 0.9 mmol/L (ref 0.5–1.9)

## 2021-04-08 MED ORDER — CEPHALEXIN 500 MG PO CAPS: 500.0000 mg | ORAL_CAPSULE | Freq: Three times a day (TID) | ORAL | 0 refills | Status: AC

## 2021-04-08 MED ORDER — LIDOCAINE-EPINEPHRINE (PF) 2 %-1:200000 IJ SOLN
10.0000 mL | Freq: Once | INTRAMUSCULAR | Status: AC
  Administered 2021-04-08: 10 mL via INTRADERMAL
  Filled 2021-04-08: qty 20

## 2021-04-08 MED ORDER — IOHEXOL 350 MG/ML SOLN
80.0000 mL | Freq: Once | INTRAVENOUS | Status: AC | PRN
  Administered 2021-04-08: 80 mL via INTRAVENOUS

## 2021-04-08 MED ORDER — SODIUM CHLORIDE 0.9 % IV BOLUS
1000.0000 mL | Freq: Once | INTRAVENOUS | Status: AC
  Administered 2021-04-08: 1000 mL via INTRAVENOUS

## 2021-04-08 NOTE — ED Notes (Signed)
Urine obtained and sent to lab  

## 2021-04-08 NOTE — ED Provider Notes (Signed)
[facility] EMERGENCY DEPT Provider Note   CSN: [id number] Arrival date & time: 04/08/21  1093     History Chief Complaint  Patient presents with   Kayla Ward    Kayla Ward is a 75 y.o. female.  HPI     Yesterday saw Dr., had regular blood work and pneumonia shot  Thought had UTI yesterday but has not had results, since Friday having dysuria and frequent urination Woke up this AM, felt fatigued and groggy Then got dressed and felt sort of tired, dizzy, sat on tub, then felt significant nausea and waves of pain through upper stomach/epigastric area, lower chest pain, pain and nausea combination Then passed out and woke up on the floor, not out very long, husband came in Has had intermittent dark stool over months, none today 68yrs ago had syncopal episode In Feb had syncope as well during ECHO Tired  Past Medical History:  Diagnosis Date   Aortic regurgitation    AS (aortic stenosis)    Hyperlipidemia    Seasonal allergies     Patient Active Problem List   Diagnosis Date Noted   Nonrheumatic aortic valve stenosis 11/06/2020   Nonrheumatic aortic valve insufficiency 10/31/2019   Mixed hyperlipidemia 10/31/2019    History reviewed. No pertinent surgical history.   OB History   No obstetric history on file.     Family History  Problem Relation Age of Onset   Cancer Mother    Cancer Father    Heart disease Father    Heart murmur Sister     Social History   Tobacco Use   Smoking status: Never   Smokeless tobacco: Never  Vaping Use   Vaping Use: Never used  Substance Use Topics   Alcohol use: Yes    Comment: wine occ   Drug use: No    Home Medications Prior to Admission medications   Medication Sig Start Date End Date Taking? Authorizing Provider  acetaminophen (TYLENOL) 500 MG tablet Take 1,000 mg by mouth every 6 (six) hours as needed for pain.   Yes [provider]  Ascorbic Acid (VITAMIN C PO) Take 2,000 mg by mouth daily.     Yes [provider]  calcium carbonate (OS-CAL) 1250 (500 Ca) MG chewable tablet Chew 1 tablet by mouth daily.   Yes [provider]  cephALEXin (KEFLEX) 500 MG capsule Take 1 capsule (500 mg total) by mouth 3 (three) times daily for 10 days. 04/08/21 04/18/21 Yes Alvira Monday, MD  cetirizine (ZYRTEC) 10 MG tablet Take 10 mg by mouth daily.   Yes [provider]  diclofenac sodium (VOLTAREN) 1 % GEL Apply 2 g topically daily as needed (pain).    Yes [provider]  fluticasone (FLONASE) 50 MCG/ACT nasal spray Place 2 sprays into the nose daily.   Yes [provider]  Multiple Vitamin (MULTIVITAMIN WITH MINERALS) TABS tablet Take 1 tablet by mouth daily.   Yes [provider]  rosuvastatin (CRESTOR) 10 MG tablet Take 1 tablet (10 mg total) by mouth daily. 12/12/20  Yes Patwardhan, Manish J, MD    Allergies    Bactrim [sulfamethoxazole-trimethoprim], Sulfa antibiotics, and Latex  Review of Systems   Review of Systems  Constitutional:  Positive for activity change, appetite change and fatigue.  Respiratory:  Negative for cough and shortness of breath.   Cardiovascular:  Positive for chest pain.  Gastrointestinal:  Positive for abdominal pain and nausea. Negative for diarrhea and vomiting. Blood in stool: black stool. Genitourinary:  Positive for dysuria and frequency.  Musculoskeletal:  Negative for back pain.  Skin:  Negative for rash.  Neurological:  Positive for syncope and light-headedness. Negative for weakness, numbness and headaches.   Physical Exam Updated Vital Signs BP (!) 112/56   Pulse 79   Temp 98.9 F (37.2 C) (Oral)   Resp 15   SpO2 97%   Physical Exam Vitals and nursing note reviewed.  Constitutional:      General: She is not in acute distress.    Appearance: She is well-developed. She is not diaphoretic.  HENT:     Head: Normocephalic and atraumatic.  Eyes:     Conjunctiva/sclera: Conjunctivae normal.   Cardiovascular:     Rate and Rhythm: Normal rate and regular rhythm.     Heart sounds: Normal heart sounds. No murmur heard.   No friction rub. No gallop.  Pulmonary:     Effort: Pulmonary effort is normal. No respiratory distress.     Breath sounds: Normal breath sounds. No wheezing or rales.  Abdominal:     General: There is no distension.     Palpations: Abdomen is soft.     Tenderness: There is abdominal tenderness (epigastric). There is no guarding.  Musculoskeletal:        General: No tenderness.     Cervical back: Normal range of motion.  Skin:    General: Skin is warm and dry.     Findings: No erythema or rash.  Neurological:     Mental Status: She is alert and oriented to person, place, and time.    ED Results / Procedures / Treatments   Labs (all labs ordered are listed, but only abnormal results are displayed) Labs Reviewed  CBC WITH DIFFERENTIAL/PLATELET - Abnormal; Notable for the following components:      Result Value   WBC 11.2 (*)    Neutro Abs 9.3 (*)    All other components within normal limits  COMPREHENSIVE METABOLIC PANEL - Abnormal; Notable for the following components:   Glucose, Bld 110 (*)    All other components within normal limits  URINALYSIS, ROUTINE W REFLEX MICROSCOPIC - Abnormal; Notable for the following components:   Color, Urine COLORLESS (*)    Ketones, ur TRACE (*)    Leukocytes,Ua MODERATE (*)    Bacteria, UA FEW (*)    All other components within normal limits  RESP PANEL BY RT-PCR (FLU A&B, COVID) [id number]  URINE CULTURE  LIPASE, BLOOD  LACTIC ACID, PLASMA  TROPONIN I (HIGH SENSITIVITY)  TROPONIN I (HIGH SENSITIVITY)    EKG EKG Interpretation  Date/Time:  Wednesday April 08 2021 08:33:08 EDT Ventricular Rate:  82 PR Interval:  97 QRS Duration: 77 QT Interval:  366 QTC Calculation: 428 R Axis:   72 Text Interpretation: Sinus rhythm Short PR interval Borderline repolarization abnormality No significant change since last  tracing Confirmed by Alvira MondaySchlossman, Agusta Hackenberg ([id number]) on 04/08/2021 8:51:50 AM  Radiology CT Head Wo Contrast  Result Date: 04/08/2021 CLINICAL DATA:  Patient fell this morning in the bathroom after syncope. Struck head on floor with posterior scalp laceration. EXAM: CT HEAD WITHOUT CONTRAST CT CERVICAL SPINE WITHOUT CONTRAST TECHNIQUE: Multidetector CT imaging of the head and cervical spine was performed following the standard protocol without intravenous contrast. Multiplanar CT image reconstructions of the cervical spine were also generated. COMPARISON:  Head CT 03/28/2019 FINDINGS: CT HEAD FINDINGS Brain: There is no evidence for acute hemorrhage, hydrocephalus, mass lesion, or abnormal extra-axial fluid collection. No definite CT evidence for  acute infarction. Vascular: No hyperdense vessel or unexpected calcification. Skull: No evidence for fracture. No worrisome lytic or sclerotic lesion. Sinuses/Orbits: The visualized paranasal sinuses and mastoid air cells are clear. Visualized portions of the globes and intraorbital fat are unremarkable. Other: None. CT CERVICAL SPINE FINDINGS Alignment: Normal. Skull base and vertebrae: No acute fracture. No primary bone lesion or focal pathologic process. Soft tissues and spinal canal: No prevertebral fluid or swelling. No visible canal hematoma. Disc levels: Loss of disc height with endplate degeneration noted C4-5 into a more mild degree at C5-6 and C6-7. Upper chest: Unremarkable. Other: None. IMPRESSION: 1. Stable head CT.  No acute intracranial abnormality. 2. Unremarkable CT cervical spine.  No evidence of fracture. Electronically Signed   By: Kennith Center M.D.   On: 04/08/2021 11:00   CT Cervical Spine Wo Contrast  Result Date: 04/08/2021 CLINICAL DATA:  Patient fell this morning in the bathroom after syncope. Struck head on floor with posterior scalp laceration. EXAM: CT HEAD WITHOUT CONTRAST CT CERVICAL SPINE WITHOUT CONTRAST TECHNIQUE: Multidetector CT imaging  of the head and cervical spine was performed following the standard protocol without intravenous contrast. Multiplanar CT image reconstructions of the cervical spine were also generated. COMPARISON:  Head CT 03/28/2019 FINDINGS: CT HEAD FINDINGS Brain: There is no evidence for acute hemorrhage, hydrocephalus, mass lesion, or abnormal extra-axial fluid collection. No definite CT evidence for acute infarction. Vascular: No hyperdense vessel or unexpected calcification. Skull: No evidence for fracture. No worrisome lytic or sclerotic lesion. Sinuses/Orbits: The visualized paranasal sinuses and mastoid air cells are clear. Visualized portions of the globes and intraorbital fat are unremarkable. Other: None. CT CERVICAL SPINE FINDINGS Alignment: Normal. Skull base and vertebrae: No acute fracture. No primary bone lesion or focal pathologic process. Soft tissues and spinal canal: No prevertebral fluid or swelling. No visible canal hematoma. Disc levels: Loss of disc height with endplate degeneration noted C4-5 into a more mild degree at C5-6 and C6-7. Upper chest: Unremarkable. Other: None. IMPRESSION: 1. Stable head CT.  No acute intracranial abnormality. 2. Unremarkable CT cervical spine.  No evidence of fracture. Electronically Signed   By: Kennith Center M.D.   On: 04/08/2021 11:00   CT Angio Chest/Abd/Pel for Dissection W and/or Wo Contrast  Result Date: 04/08/2021 CLINICAL DATA:  Chest and abdominal pain.  Suspect dissection. EXAM: CT ANGIOGRAPHY CHEST, ABDOMEN AND PELVIS TECHNIQUE: Non-contrast CT of the chest was initially obtained. Multidetector CT imaging through the chest, abdomen and pelvis was performed using the standard protocol during bolus administration of intravenous contrast. Multiplanar reconstructed images and MIPs were obtained and reviewed to evaluate the vascular anatomy. CONTRAST:  91mL OMNIPAQUE IOHEXOL 350 MG/ML SOLN COMPARISON:  CT abdomen pelvis 07/22/2013 FINDINGS: CTA CHEST FINDINGS  Cardiovascular: Heart size within normal limits. No aortic intramural hematoma identified on precontrast images. Aortic valve cusp calcifications are noted. No aortic dissection. Mediastinum/Nodes: No enlarged mediastinal, hilar, or axillary lymph nodes. Thyroid gland, trachea, and esophagus demonstrate no significant findings. Lungs/Pleura: Lungs are clear. No pleural effusion or pneumothorax. Musculoskeletal: No chest wall abnormality. No acute or significant osseous findings. Review of the MIP images confirms the above findings. CTA ABDOMEN AND PELVIS FINDINGS VASCULAR Aorta: Minimal scattered calcified atheromatous plaque without flow-limiting stenosis or aneurysm. Celiac: Patent without evidence of aneurysm, dissection, vasculitis or significant stenosis. SMA: Patent without evidence of aneurysm, dissection, vasculitis or significant stenosis. Renals: Both renal arteries are patent without evidence of aneurysm, dissection, vasculitis, fibromuscular dysplasia or significant stenosis. IMA: Patent. Inflow:  Patent without evidence of aneurysm, dissection, vasculitis or significant stenosis. Veins: No obvious venous abnormality within the limitations of this arterial phase study. Review of the MIP images confirms the above findings. NON-VASCULAR Hepatobiliary: No significant abnormality of the liver or gallbladder. There has been interval progressive dilatation of the common bile duct which now measures 18 mm in diameter compared to 11 mm on the prior study. Pancreas: Unremarkable. No pancreatic ductal dilatation or surrounding inflammatory changes. Spleen: Normal in size without focal abnormality. Adrenals/Urinary Tract: Adrenal glands are normal. Previously seen low-density lesion at the upper pole of the left kidney is no longer identified and has likely resolved. Kidneys, ureters, and bladder otherwise unremarkable. Stomach/Bowel: Stomach is within normal limits. Appendix appears normal. No evidence of bowel  wall thickening, distention, or inflammatory changes. Lymphatic: No enlarged abdominal or pelvic lymph nodes. Reproductive: Uterus and bilateral adnexa are unremarkable. Other: No abdominal wall hernia or abnormality. No abdominopelvic ascites. Musculoskeletal: No acute or significant osseous findings. Review of the MIP images confirms the above findings. IMPRESSION: 1. No aortic dissection. No acute abnormality of the chest, abdomen, or pelvis. 2. Progressive dilatation of the distal common bile duct now measuring up to 18 mm. Further evaluation with abdominal MRI/MRCP is recommended. Electronically Signed   By: Acquanetta Belling M.D.   On: 04/08/2021 10:50    Procedures Procedures   Medications Ordered in ED Medications  sodium chloride 0.9 % bolus 1,000 mL (0 mLs Intravenous Stopped 04/08/21 1400)  lidocaine-EPINEPHrine (XYLOCAINE W/EPI) 2 %-1:200000 (PF) injection 10 mL (10 mLs Intradermal Given 04/08/21 0931)  iohexol (OMNIPAQUE) 350 MG/ML injection 80 mL (80 mLs Intravenous Contrast Given 04/08/21 1008)    ED Course  I have reviewed the triage vital signs and the nursing notes.  Pertinent labs & imaging results that were available during my care of the patient were reviewed by me and considered in my medical decision making (see chart for details).    MDM Rules/Calculators/A&P                           75 year old female with a history of aortic stenosis, aortic regurgitation, hyperlipidemia, presents with concern for syncope preceded by lightheadedness, nausea with epigastric abdominal and lower chest pain, days of dysuria and intermittent black stool for months,  generalized weakness, and fall with head trauma.  EKG evaluate by me and shows no significant abnormalities.  Blood pressures are 100/60, which patient reports is baseline for her.  Differential diagnosis for syncopal episode in setting of previous generalized weakness, urinary symptoms, black stool, chest and abdominal pain  include peptic ulcer disease with anemia/GI bleed, sepsis secondary to urinary source, worsening aortic stenosis, aortic dissection, cholecystitis, ACS, arrhythmia.  Labs show hemoglobin 12.8.  Electrolytes without significant abnormalities.  Troponin negative x2 and doubt ACS.  CT head and cervical spine done given trauma shows no acute abnormalities.  CT dissection study done given chest and abdominal pain preceding syncope shows progressive dilation of the common bile duct now measuring up to 18mm and no sign of dissection  Reports no abdominal pain  on reevaluation, has notably normal lipase and transaminases, is afebrile without pain and in this setting feel her common bile duct dilation can be further evaluated as an outpatient.  She does describe urinary symptoms, UA concerning for possible UTI. Will put on abx. Had a syncopal episode in 2020 that was also in setting of UTI. Denies exertional type lightheadedness or other syncopal  episodes--at this time do not feel she needs emergent syncope admission or aortic valve replacement but does need further evaluation with Cardiology as an outpatient.   Recommend PCP, Cardiology follow up for AS, GI follow up for dilated CBD, and treatment for UTI and return to the ED for new or worsening symptom.      Final Clinical Impression(s) / ED Diagnoses Final diagnoses:  Syncope, unspecified syncope type  Acute cystitis without hematuria  Nausea    Rx / DC Orders ED Discharge Orders          Ordered    cephALEXin (KEFLEX) 500 MG capsule  3 times daily        04/08/21 1405             Alvira Monday, MD 04/08/21 2254

## 2021-04-08 NOTE — ED Triage Notes (Signed)
Pt reports lightheadedness at home this am causing her to fall.

## 2021-04-10 LAB — URINE CULTURE: Culture: >=100000 — AB

## 2021-04-11 NOTE — Progress Notes (Signed)
ED Antimicrobial Stewardship Positive Culture Follow Up   Kayla Ward is an 75 y.o. female who presented to [facility] on 04/08/2021 with a chief complaint of  Chief Complaint  Patient presents with   Fall    Recent Results (from the past 720 hour(s))  Urine Culture     Status: Abnormal   Collection Time: 04/08/21  8:48 AM   Specimen: Urine, Clean Catch  Result Value Ref Range Status   Specimen Description   Final    URINE, CLEAN CATCH Performed at [facility], [address], [place], [place] [address]    Special Requests   Final    NONE Performed at [facility], [address] [place], [place] [address]    Culture >=100,000 COLONIES/mL STAPHYLOCOCCUS SAPROPHYTICUS (A)  Final   Report Status 04/10/2021 FINAL  Final   Organism ID, Bacteria STAPHYLOCOCCUS SAPROPHYTICUS (A)  Final      Susceptibility   Staphylococcus saprophyticus - MIC*    CIPROFLOXACIN <=0.5 SENSITIVE Sensitive     GENTAMICIN <=0.5 SENSITIVE Sensitive     NITROFURANTOIN <=16 SENSITIVE Sensitive     OXACILLIN 2 RESISTANT Resistant     TETRACYCLINE <=1 SENSITIVE Sensitive     VANCOMYCIN 1 SENSITIVE Sensitive     TRIMETH/SULFA <=10 SENSITIVE Sensitive     CLINDAMYCIN <=0.25 SENSITIVE Sensitive     RIFAMPIN <=0.5 SENSITIVE Sensitive     Inducible Clindamycin NEGATIVE Sensitive     * >=100,000 COLONIES/mL STAPHYLOCOCCUS SAPROPHYTICUS  Resp Panel by RT-PCR (Flu A&B, Covid) Nasopharyngeal Swab     Status: None   Collection Time: 04/08/21  8:53 AM   Specimen: Nasopharyngeal Swab; Nasopharyngeal(NP) swabs in vial transport medium  Result Value Ref Range Status   SARS Coronavirus 2 by RT PCR NEGATIVE NEGATIVE Final    Comment: (NOTE) SARS-CoV-2 target nucleic acids are NOT DETECTED.  The SARS-CoV-2 RNA is generally detectable in upper respiratory specimens during the acute phase of infection. The lowest concentration of SARS-CoV-2 viral copies this assay can detect  is 138 copies/mL. A negative result does not preclude SARS-Cov-2 infection and should not be used as the sole basis for treatment or other patient management decisions. A negative result may occur with  improper specimen collection/handling, submission of specimen other than nasopharyngeal swab, presence of viral mutation(s) within the areas targeted by this assay, and inadequate number of viral copies(<138 copies/mL). A negative result must be combined with clinical observations, patient history, and epidemiological information. The expected result is Negative.  Fact Sheet for Patients:  BloggerCourse.com  Fact Sheet for Healthcare Providers:  SeriousBroker.it  This test is no t yet approved or cleared by the [place] FDA and  has been authorized for detection and/or diagnosis of SARS-CoV-2 by FDA under an Emergency Use Authorization (EUA). This EUA will remain  in effect (meaning this test can be used) for the duration of the COVID-19 declaration under Section 564(b)(1) of the Act, 21 U.S.C.section 360bbb-3(b)(1), unless the authorization is terminated  or revoked sooner.       Influenza A by PCR NEGATIVE NEGATIVE Final   Influenza B by PCR NEGATIVE NEGATIVE Final    Comment: (NOTE) The Xpert Xpress SARS-CoV-2/FLU/RSV plus assay is intended as an aid in the diagnosis of influenza from Nasopharyngeal swab specimens and should not be used as a sole basis for treatment. Nasal washings and aspirates are unacceptable for Xpert Xpress SARS-CoV-2/FLU/RSV testing.  Fact Sheet for Patients: BloggerCourse.com  Fact Sheet for Healthcare Providers: SeriousBroker.it  This  test is not yet approved or cleared by the [place] and has been authorized for detection and/or diagnosis of SARS-CoV-2 by FDA under an Emergency Use Authorization (EUA). This EUA will remain in effect  (meaning this test can be used) for the duration of the COVID-19 declaration under Section 564(b)(1) of the Act, 21 U.S.C. section 360bbb-3(b)(1), unless the authorization is terminated or revoked.  Performed at [facility], [address], [place], [place] [address]     [x]  Treated with Keflex, organism resistant to prescribed antimicrobial  New antibiotic prescription: Macrobid 100mg  BID x 5d  ED Provider: , PA-C   04/11/2021, 2:45 PM Clinical Pharmacist Monday - Friday phone -  [phone] Saturday - Sunday phone - [phone]

## 2021-04-12 ENCOUNTER — Telehealth: Payer: Self-pay | Admitting: Emergency Medicine

## 2021-04-12 NOTE — Telephone Encounter (Signed)
Post ED Visit - Positive Culture Follow-up: Unsuccessful Patient Follow-up  Culture assessed and recommendations reviewed by:  []  , Pharm.D. []  Enzo Bi, .D., BCPS AQ-ID []  Celedonio Miyamoto, Pharm.D., BCPS []  [address], Pharm.D., BCPS []  [place], Garvin Fila.D., BCPS, AAHIVP []  , Pharm.D., BCPS, AAHIVP [x]  Georgina Pillion, PharmD []  , PharmD, BCPS  Positive urine culture  []  Patient discharged without antimicrobial prescription and treatment is now indicated [x]  Organism is resistant to prescribed ED discharge antimicrobial []  Patient with positive blood cultures   Unable to contact patient at phone number on file, letter will be sent to address on file  Plan: d/c Keflex Start Macrobid 100 mg BID for five days - [place]  [address] Ridhi Hoffert 04/12/2021, 2:05 PM

## 2021-04-15 ENCOUNTER — Encounter (HOSPITAL_BASED_OUTPATIENT_CLINIC_OR_DEPARTMENT_OTHER): Payer: Self-pay

## 2021-04-15 ENCOUNTER — Emergency Department (HOSPITAL_BASED_OUTPATIENT_CLINIC_OR_DEPARTMENT_OTHER)
Admission: EM | Admit: 2021-04-15 | Discharge: 2021-04-15 | Disposition: A | Discharge status: Home / Self Care | Payer: Medicare Other | Attending: Emergency Medicine | Admitting: Emergency Medicine

## 2021-04-15 ENCOUNTER — Other Ambulatory Visit: Payer: Self-pay

## 2021-04-15 DIAGNOSIS — Z91040 Latex allergy status: Secondary | ICD-10-CM | POA: Insufficient documentation

## 2021-04-15 DIAGNOSIS — Z4802 Encounter for removal of sutures: Secondary | ICD-10-CM | POA: Insufficient documentation

## 2021-04-15 DIAGNOSIS — X58XXXD Exposure to other specified factors, subsequent encounter: Secondary | ICD-10-CM | POA: Diagnosis not present

## 2021-04-15 DIAGNOSIS — S0101XD Laceration without foreign body of scalp, subsequent encounter: Secondary | ICD-10-CM | POA: Insufficient documentation

## 2021-04-15 NOTE — ED Triage Notes (Signed)
Pt returns today to have staples removed from scalp.

## 2021-04-15 NOTE — ED Provider Notes (Signed)
[facility] [place] EMERGENCY DEPARTMENT Provider Note  CSN: [id number] Arrival date & time: 04/15/21 7494    History Chief Complaint  Patient presents with   Suture / Staple Removal    Kayla Ward is a 75 y.o. female presents for staple removal after a scalp laceration she sustained about a week ago. She has been doing well otherwise, no pain, swelling or drainage.    Past Medical History:  Diagnosis Date   Aortic regurgitation    AS (aortic stenosis)    Hyperlipidemia    Seasonal allergies     History reviewed. No pertinent surgical history.  Family History  Problem Relation Age of Onset   Cancer Mother    Cancer Father    Heart disease Father    Heart murmur Sister     Social History   Tobacco Use   Smoking status: Never   Smokeless tobacco: Never  Vaping Use   Vaping Use: Never used  Substance Use Topics   Alcohol use: Yes    Comment: wine occ   Drug use: No     Home Medications Prior to Admission medications   Medication Sig Start Date End Date Taking? Authorizing Provider  acetaminophen (TYLENOL) 500 MG tablet Take 1,000 mg by mouth every 6 (six) hours as needed for pain.    [provider]  Ascorbic Acid (VITAMIN C PO) Take 2,000 mg by mouth daily.     [provider]  calcium carbonate (OS-CAL) 1250 (500 Ca) MG chewable tablet Chew 1 tablet by mouth daily.    [provider]  cephALEXin (KEFLEX) 500 MG capsule Take 1 capsule (500 mg total) by mouth 3 (three) times daily for 10 days. 04/08/21 04/18/21  Alvira Monday, MD  cetirizine (ZYRTEC) 10 MG tablet Take 10 mg by mouth daily.    [provider]  diclofenac sodium (VOLTAREN) 1 % GEL Apply 2 g topically daily as needed (pain).     [provider]  fluticasone (FLONASE) 50 MCG/ACT nasal spray Place 2 sprays into the nose daily.    [provider]  Multiple Vitamin (MULTIVITAMIN WITH MINERALS) TABS tablet Take 1 tablet by mouth daily.     [provider]  rosuvastatin (CRESTOR) 10 MG tablet Take 1 tablet (10 mg total) by mouth daily. 12/12/20   Patwardhan, Anabel Bene, MD     Allergies    Bactrim [sulfamethoxazole-trimethoprim], Sulfa antibiotics, and Latex   Review of Systems   Review of Systems  Skin:  Positive for wound.    Physical Exam BP 114/75 (BP Location: Right Arm)   Pulse 74   Temp 98.2 F (36.8 C) (Oral)   Resp 15   Ht 5\' 3"  (1.6 m)   Wt 59.4 kg   SpO2 99%   BMI 23.20 kg/m   Physical Exam Vitals and nursing note reviewed.  HENT:     Head: Normocephalic.     Comments: Well healed laceration to crown of scalp    Nose: Nose normal.  Eyes:     Extraocular Movements: Extraocular movements intact.  Pulmonary:     Effort: Pulmonary effort is normal.  Musculoskeletal:        General: Normal range of motion.     Cervical back: Neck supple.  Skin:    Findings: No rash (on exposed skin).  Neurological:     Mental Status: She is alert and oriented to person, place, and time.  Psychiatric:        Mood and Affect: Mood  normal.     ED Results / Procedures / Treatments   Labs (all labs ordered are listed, but only abnormal results are displayed) Labs Reviewed - No data to display  EKG None   Radiology No results found.  Procedures .Suture Removal  Date/Time: 04/15/2021 10:30 AM Performed by: Pollyann Savoy, MD Authorized by: Pollyann Savoy, MD   Consent:    Consent obtained:  Verbal Location:    Location:  Head/neck   Head/neck location:  Scalp Procedure details:    Wound appearance:  No signs of infection   Number of staples removed:  4 Post-procedure details:    Procedure completion:  Tolerated well, no immediate complications  Medications Ordered in the ED Medications - No data to display   MDM Rules/Calculators/A&P MDM   ED Course  I have reviewed the triage vital signs and the nursing notes.  Pertinent labs & imaging results that were available  during my care of the patient were reviewed by me and considered in my medical decision making (see chart for details).     Final Clinical Impression(s) / ED Diagnoses Final diagnoses:  Encounter for staple removal    Rx / DC Orders ED Discharge Orders     None        Pollyann Savoy, MD 04/15/21 1031

## 2021-04-27 ENCOUNTER — Telehealth: Payer: Self-pay | Admitting: Emergency Medicine

## 2021-04-28 ENCOUNTER — Ambulatory Visit: Payer: Medicare Other

## 2021-04-28 ENCOUNTER — Other Ambulatory Visit: Payer: Self-pay

## 2021-04-28 DIAGNOSIS — I350 Nonrheumatic aortic (valve) stenosis: Secondary | ICD-10-CM

## 2021-04-28 DIAGNOSIS — I351 Nonrheumatic aortic (valve) insufficiency: Secondary | ICD-10-CM

## 2021-05-06 ENCOUNTER — Ambulatory Visit: Payer: Medicare Other | Admitting: Cardiology

## 2021-05-14 ENCOUNTER — Ambulatory Visit: Payer: Medicare Other | Admitting: Cardiology

## 2021-05-14 ENCOUNTER — Encounter: Payer: Self-pay | Admitting: Cardiology

## 2021-05-14 ENCOUNTER — Other Ambulatory Visit: Payer: Self-pay

## 2021-05-14 VITALS — BP 109/55 | HR 85 | Temp 98.0°F | Resp 16 | Ht 63.0 in | Wt 125.0 lb

## 2021-05-14 DIAGNOSIS — I350 Nonrheumatic aortic (valve) stenosis: Secondary | ICD-10-CM

## 2021-05-14 DIAGNOSIS — I351 Nonrheumatic aortic (valve) insufficiency: Secondary | ICD-10-CM

## 2021-05-14 DIAGNOSIS — R55 Syncope and collapse: Secondary | ICD-10-CM | POA: Insufficient documentation

## 2021-05-14 DIAGNOSIS — E782 Mixed hyperlipidemia: Secondary | ICD-10-CM

## 2021-05-14 NOTE — Progress Notes (Signed)
Patient is here for follow up visit.  Subjective:   '@Patient'  ID: Kayla Ward, female    DOB: 06/20/1946, 75 y.o.   MRN: [id number]   Chief Complaint  Patient presents with   Nonrheumatic aortic valve insufficiency   Follow-up    49 month    HPI  75 year old Caucasian female with hyperlipidemia, moderate aortic stenosis and moderate aortic regurgitation.  Personally reviewed echocardiogram, details below. Patient had one episode of syncope a few weeks ago. She had been having reaction to a pneumonia shot the day before, had not been feeling well. She also had abdominal pain, followed by an episode of lightheadedness and brief syncope that led to scalp laceration requiring sutures. She has not had any exertional syncope symptoms. Also denies exertional dyspnea or chest pain symptoms.   Of note, in summer of 2020, she had an episode of UTI, along with any episode of syncope that was attributed to dehydration and infection.   Current Outpatient Medications on File Prior to Visit  Medication Sig Dispense Refill   acetaminophen (TYLENOL) 500 MG tablet Take 1,000 mg by mouth every 6 (six) hours as needed for pain.     Ascorbic Acid (VITAMIN C PO) Take 2,000 mg by mouth daily.      calcium carbonate (OS-CAL) 1250 (500 Ca) MG chewable tablet Chew 1 tablet by mouth daily.     cetirizine (ZYRTEC) 10 MG tablet Take 10 mg by mouth daily.     diclofenac sodium (VOLTAREN) 1 % GEL Apply 2 g topically daily as needed (pain).      fluticasone (FLONASE) 50 MCG/ACT nasal spray Place 2 sprays into the nose daily.     Multiple Vitamin (MULTIVITAMIN WITH MINERALS) TABS tablet Take 1 tablet by mouth daily.     rosuvastatin (CRESTOR) 10 MG tablet Take 1 tablet (10 mg total) by mouth daily. 90 tablet 3   No current facility-administered medications on file prior to visit.    Cardiovascular studies:  EKG 04/08/2021: Sinus rhythm Short PR interval Borderline repolarization abnormality No  significant change since last tracing  Echocardiogram 04/28/2021:  Normal LV systolic function with visual EF 60-65%. Left ventricle cavity  is normal in size. Normal left ventricular wall thickness. Normal global  wall motion. Normal diastolic filling pattern, normal LAP.  Moderate aortic stenosis. Peak velocity 3.9ms, Peak Gradient 43.799mg,  Mean Gradient 27.80m52m, AVA 0.70cm, DI 0.2.  Moderate (Grade II) aortic regurgitation.  Mild (Grade I) mitral regurgitation.  Mild tricuspid regurgitation.  Compared to study 10/30/2020 severity of aortic disease remains relatively  stable.   Recent labs: 04/08/2021: Glucose 110, BUN/Cr 8/0.83. EGFR >60. Na/K 139/4.2. Rest of the CMP normal H/H 12/37. MCV 91. Platelets 158 Trop HS 2,3  09/2020: Chol 164, TG 43, HDL 77, LDL 78   10/02/2020: Chol 164, TG 43, HDL 77, LDL 78  03/27/2020: Glucose 100, BUN/Cr 7/0.81. EGFR >60. Na/K 142/4.0.  H/H 13.8/40.4. MCV 93. Platelets 182  10/23/2018: Chol 173, TG 62, HDL 73, LDL 88   Review of Systems  Cardiovascular:  Positive for syncope. Negative for chest pain, dyspnea on exertion, leg swelling and palpitations.  Gastrointestinal:  Negative for abdominal pain, nausea and vomiting.  Genitourinary:  Positive for dysuria (In 03/2019).  All other systems reviewed and are negative.     Objective:    Vitals:   05/14/21 1053  BP: (!) 109/55  Pulse: 85  Resp: 16  Temp: 98 F (36.7 C)  SpO2: 98%  Physical Exam Vitals and nursing note reviewed.  HENT:     Head: Atraumatic.  Cardiovascular:     Rate and Rhythm: Normal rate and regular rhythm.     Pulses: Intact distal pulses.     Heart sounds: Murmur heard.  Harsh midsystolic murmur is present with a grade of 2/6 at the upper right sternal border radiating to the neck.  High-pitched blowing decrescendo early diastolic murmur is present with a grade of 2/4 at the upper right sternal border radiating to the apex.  Pulmonary:     Effort:  Pulmonary effort is normal.     Breath sounds: Normal breath sounds. No wheezing or rales.  Musculoskeletal:     Right lower leg: No edema.     Left lower leg: No edema.        Assessment & Recommendations:   75 year old Caucasian female with hyperlipidemia, moderate aortic stenosis and moderate aortic regurgitation.  Moderate aortic regurgitation and stenosis: Reviewed recent echocardiogram 04/2021 showing mixed valvular disease. Mean PG relatively stable, partially contributed by mod AI. She does not have any exertional symptoms to correlate with her mixed valvular disease. Recent syncope episode likely vasovagal, triggered by abdominal pain-similar to an episode in 2020.  Recommend repeat echocardiogram in 6 months  Mixed hyperlipidemia Well controlled.  F/u in 6 months  Kayla Ward [name], MD [facility] Pager: [phone] Office: [phone] If no answer Cell [phone]

## 2021-06-09 ENCOUNTER — Other Ambulatory Visit: Payer: Self-pay | Admitting: Gastroenterology

## 2021-07-08 ENCOUNTER — Encounter (HOSPITAL_COMMUNITY): Payer: Self-pay | Admitting: Gastroenterology

## 2021-07-17 ENCOUNTER — Encounter (HOSPITAL_COMMUNITY)
Admission: RE | Disposition: A | Discharge status: Home / Self Care | Payer: Self-pay | Source: Ambulatory Visit | Attending: Gastroenterology

## 2021-07-17 ENCOUNTER — Ambulatory Visit (HOSPITAL_COMMUNITY): Payer: Medicare Other | Admitting: Registered Nurse

## 2021-07-17 ENCOUNTER — Ambulatory Visit (HOSPITAL_COMMUNITY)
Admission: RE | Admit: 2021-07-17 | Discharge: 2021-07-17 | Disposition: A | Discharge status: Home / Self Care | Payer: Medicare Other | Source: Ambulatory Visit | Attending: Gastroenterology | Admitting: Gastroenterology

## 2021-07-17 ENCOUNTER — Other Ambulatory Visit: Payer: Self-pay

## 2021-07-17 DIAGNOSIS — K838 Other specified diseases of biliary tract: Secondary | ICD-10-CM | POA: Diagnosis present

## 2021-07-17 HISTORY — PX: ESOPHAGOGASTRODUODENOSCOPY: SHX5428

## 2021-07-17 HISTORY — PX: UPPER ESOPHAGEAL ENDOSCOPIC ULTRASOUND (EUS): SHX6562

## 2021-07-17 SURGERY — UPPER ESOPHAGEAL ENDOSCOPIC ULTRASOUND (EUS)
Anesthesia: Monitor Anesthesia Care

## 2021-07-17 MED ORDER — LIDOCAINE HCL (CARDIAC) PF 100 MG/5ML IV SOSY
PREFILLED_SYRINGE | INTRAVENOUS | Status: DC | PRN
  Administered 2021-07-17: 50 mg via INTRAVENOUS

## 2021-07-17 MED ORDER — LACTATED RINGERS IV SOLN: INTRAVENOUS | Status: DC | PRN

## 2021-07-17 MED ORDER — SODIUM CHLORIDE 0.9 % IV SOLN: INTRAVENOUS | Status: DC

## 2021-07-17 MED ORDER — PROPOFOL 500 MG/50ML IV EMUL
INTRAVENOUS | Status: DC | PRN
  Administered 2021-07-17: 275 ug/kg/min via INTRAVENOUS

## 2021-07-17 NOTE — Anesthesia Procedure Notes (Addendum)
Procedure Name: MAC Date/Time: 07/17/2021 9:02 AM Performed by: Lissa Morales, CRNA Pre-anesthesia Checklist: Patient identified, Emergency Drugs available, Suction available and Patient being monitored Patient Re-evaluated:Patient Re-evaluated prior to induction Oxygen Delivery Method: Simple face mask Preoxygenation: Pre-oxygenation with 100% oxygen (pom mask) Placement Confirmation: positive ETCO2

## 2021-07-17 NOTE — Op Note (Signed)
[facility] Patient Name: Piney Lachman Procedure Date: 07/17/2021 MRN: [id number] Attending MD: Jeani Hawking , MD Date of Birth: Aug 08, 1946 CSN: [id number] Age: 75 Admit Type: Outpatient Procedure:                Upper EUS Indications:              Common bile duct dilation (etiology unknown) seen                            on CT scan Providers:                Jeani Hawking, MD, Lorenza Evangelist, RN, Michele Mcalpine Technician Referring MD:              Medicines:                Propofol per Anesthesia Complications:            No immediate complications. Estimated Blood Loss:     Estimated blood loss: none. Procedure:                Pre-Anesthesia Assessment:                           - Prior to the procedure, a History and Physical                            was performed, and patient medications and                            allergies were reviewed. The patient's tolerance of                            previous anesthesia was also reviewed. The risks                            and benefits of the procedure and the sedation                            options and risks were discussed with the patient.                            All questions were answered, and informed consent                            was obtained. Prior Anticoagulants: The patient has                            taken no previous anticoagulant or antiplatelet                            agents. ASA Grade Assessment: II - A patient with                            mild  systemic disease. After reviewing the risks                            and benefits, the patient was deemed in                            satisfactory condition to undergo the procedure.                           - Sedation was administered by an anesthesia                            professional. Deep sedation was attained.                           After obtaining informed consent, the endoscope was                             passed under direct vision. Throughout the                            procedure, the patient's blood pressure, pulse, and                            oxygen saturations were monitored continuously. The                            [id number] ([id number]) Olympus linear ultrasound scope                            was introduced through the mouth, and advanced to                            the second part of duodenum. The upper EUS was                            accomplished without difficulty. The patient                            tolerated the procedure well. Scope In: Scope Out: Findings:      ENDOSONOGRAPHIC FINDING: :      There was no sign of significant endosonographic abnormality in the       common bile duct, in the common hepatic duct, in the gallbladder neck       and in the gallbladder body. The maximum diameter of the ducts were 5       mm. An unremarkable gallbladder, no masses, no stones, no biliary sludge       and ducts of normal caliber were identified.      There was no evidence of any biliary ductal dilation to correlate with       the recent CT scan. It may be that she passed small stones or sludge,       however, there was no evidence of any stones or sludge in the biliary       tract. The pancreas was  normal and there was no find of a mass in the       head of the pancreas. Impression:               - There was no sign of significant pathology in the                            common bile duct, in the common hepatic duct, in                            the gallbladder neck and in the gallbladder body.                           - No specimens collected. Moderate Sedation:      Not Applicable - Patient had care per Anesthesia. Recommendation:           - Patient has a contact number available for                            emergencies. The signs and symptoms of potential                            delayed complications were discussed with the                             patient. Return to normal activities tomorrow.                            Written discharge instructions were provided to the                            patient.                           - Resume previous diet.                           - Follow up as needed. Procedure Code(s):        --- Professional ---                           [id number], Esophagogastroduodenoscopy, flexible,                            transoral; with endoscopic ultrasound examination                            limited to the esophagus, stomach or duodenum, and                            adjacent structures Diagnosis Code(s):        --- Professional ---                           R93.2, Abnormal findings on diagnostic imaging of  liver and biliary tract CPT copyright 2019 American Medical Association. All rights reserved. The codes documented in this report are preliminary and upon coder review may  be revised to meet current compliance requirements. Jeani Hawking, MD Jeani Hawking, MD 07/17/2021 9:35:54 AM This report has been signed electronically. Number of Addenda: 0

## 2021-07-17 NOTE — Transfer of Care (Signed)
Immediate Anesthesia Transfer of Care Note  Patient: Kayla Ward  Procedure(s) Performed: UPPER ESOPHAGEAL ENDOSCOPIC ULTRASOUND (EUS)  Patient Location: PACU  Anesthesia Type:MAC  Level of Consciousness: awake, sedated and patient cooperative  Airway & Oxygen Therapy: Patient Spontanous Breathing and Patient connected to face mask oxygen  Post-op Assessment: Report given to RN, Post -op Vital signs reviewed and stable and Patient moving all extremities X 4  Post vital signs: stable  Last Vitals:  Vitals Value Taken Time  BP 100/47 07/17/21 0931  Temp    Pulse 66 07/17/21 0934  Resp 15 07/17/21 0934  SpO2 100 % 07/17/21 0934  Vitals shown include unvalidated device data.  Last Pain:  Vitals:   07/17/21 0850  TempSrc: Oral  PainSc: 0-No pain         Complications: No notable events documented.

## 2021-07-17 NOTE — H&P (Signed)
Kayla Ward HPI: She was at the [facility] ER for complaints of chest pain and upper abdominal pain.  Initially she was being treated for an UTI by her PCP.  At home she recalls sitting on the toilet and she felt a RUQ pain with some back pain.  She suffered a syncopal episode and she did lacerate her scalp when she slumped over.  This brought her to the ER and there was a concern for an aortic dissection, but no acute abnormalities were identified.  An incidental finding for an increase in her CBD from 11 mm up to 18 mm.  In the past she was noted to have a mild dilation of her CBD at 10 mm and work up with an MRCP was negative for any stones.  Her other main concern is her bowel habits.  A couple of months ago she started to experience some fecal leakage.  She also noted some "black" stools, but she reported using Peptobismol.  Her colonoscopy in 2020 was positive for several adenomas.  The procedure was performed for a positive Cologuard.  Past Medical History:  Diagnosis Date   Aortic regurgitation    AS (aortic stenosis)    Hyperlipidemia    Seasonal allergies     History reviewed. No pertinent surgical history.  Family History  Problem Relation Age of Onset   Cancer Mother    Cancer Father    Heart disease Father    Heart murmur Sister     Social History:  reports that she has never smoked. She has never used smokeless tobacco. She reports current alcohol use. She reports that she does not use drugs.  Allergies:  Allergies  Allergen Reactions   Bactrim [Sulfamethoxazole-Trimethoprim] Hives, Nausea And Vomiting and Other (See Comments)    Passed out   Sulfa Antibiotics Hives   Latex Rash    Medications: Scheduled: Continuous:  sodium chloride      No results found for this or any previous visit (from the past 24 hour(s)).   No results found.  ROS:  As stated above in the HPI otherwise negative.  There were no vitals taken for this visit.    PE: Gen: NAD, Alert  and Oriented HEENT:  [place]/AT, EOMI Neck: Supple, no LAD Lungs: CTA Bilaterally CV: RRR without M/G/R ABD: Soft, NTND, +BS Ext: No C/C/E  Assessment/Plan: 1) Dilated CBD - EUS.  Kayla Ward D 07/17/2021, 8:46 AM

## 2021-07-17 NOTE — Anesthesia Preprocedure Evaluation (Addendum)
Anesthesia Evaluation  Patient identified by MRN, date of birth, ID band Patient awake    Reviewed: Allergy & Precautions, NPO status , Patient's Chart, lab work & pertinent test results  Airway Mallampati: II  TM Distance: >3 FB Neck ROM: Full    Dental  (+) Dental Advisory Given   Pulmonary shortness of breath and with exertion,    Pulmonary exam normal breath sounds clear to auscultation       Cardiovascular + Valvular Problems/Murmurs AI and AS  Rhythm:Regular Rate:Normal + Systolic murmurs Echocardiogram 04/28/2021:  Normal LV systolic function with visual EF 60-65%. Left ventricle cavity is normal in size. Normal left ventricular wall thickness. Normal global wall motion. Normal diastolic filling pattern, normal LAP.  Moderate aortic stenosis. Peak velocity 3.50ms, Peak Gradient 43.732mg, Mean Gradient 27.35m69m, AVA 0.70cm, DI 0.2.  Moderate (Grade II) aortic regurgitation.  Mild (Grade I) mitral regurgitation.  Mild tricuspid regurgitation.  Compared to study 10/30/2020 severity of aortic disease remains relatively stable.    Neuro/Psych negative neurological ROS  negative psych ROS   GI/Hepatic negative GI ROS, Neg liver ROS,   Endo/Other  negative endocrine ROS  Renal/GU negative Renal ROS  negative genitourinary   Musculoskeletal negative musculoskeletal ROS (+)   Abdominal   Peds negative pediatric ROS (+)  Hematology negative hematology ROS (+)   Anesthesia Other Findings   Reproductive/Obstetrics negative OB ROS                            Anesthesia Physical Anesthesia Plan  ASA: 4  Anesthesia Plan: MAC   Post-op Pain Management:    Induction: Intravenous  PONV Risk Score and Plan: 2 and Propofol infusion, Treatment may vary due to age or medical condition and TIVA  Airway Management Planned: Natural Airway  Additional Equipment:   Intra-op Plan:    Post-operative Plan:   Informed Consent: I have reviewed the patients History and Physical, chart, labs and discussed the procedure including the risks, benefits and alternatives for the proposed anesthesia with the patient or authorized representative who has indicated his/her understanding and acceptance.     Dental advisory given  Plan Discussed with: CRNA  Anesthesia Plan Comments:        Anesthesia Quick Evaluation

## 2021-07-17 NOTE — Discharge Instructions (Signed)
YOU HAD AN ENDOSCOPIC PROCEDURE TODAY: Refer to the procedure report and other information in the discharge instructions given to you for any specific questions about what was found during the examination. If this information does not answer your questions, please call [facility] at [phone] to clarify.  ? ?YOU SHOULD EXPECT: Some feelings of bloating in the abdomen. Passage of more gas than usual. Walking can help get rid of the air that was put into your GI tract during the procedure and reduce the bloating. ? ?DIET: Your first meal following the procedure should be a light meal and then it is ok to progress to your normal diet. A half-sandwich or bowl of soup is an example of a good first meal. Heavy or fried foods are harder to digest and may make you feel nauseous or bloated. Drink plenty of fluids but you should avoid alcoholic beverages for 24 hours.  ? ?ACTIVITY: Your care partner should take you home directly after the procedure. You should plan to take it easy, moving slowly for the rest of the day. You can resume normal activity the day after the procedure however YOU SHOULD NOT DRIVE, use power tools, machinery or perform tasks that involve climbing or major physical exertion for 24 hours (because of the sedation medicines used during the test).  ? ?SYMPTOMS TO REPORT IMMEDIATELY: ?A gastroenterologist can be reached at any hour. Please call [phone]  for any of the following symptoms:  ? ?Following upper endoscopy (EGD, EUS, ERCP, esophageal dilation) ?Vomiting of blood or coffee ground material  ?New, significant abdominal pain  ?New, significant chest pain or pain under the shoulder blades  ?Painful or persistently difficult swallowing  ?New shortness of breath  ?Black, tarry-looking or red, bloody stools ? ?FOLLOW UP:  ?If any biopsies were taken you will be contacted by phone or by letter within the next 1-3 weeks. Call [phone]  if you have not heard about the biopsies in 3  weeks.  ?Please also call with any specific questions about appointments or follow up tests.  ?

## 2021-07-17 NOTE — Anesthesia Postprocedure Evaluation (Signed)
Anesthesia Post Note  Patient: Kayla Ward  Procedure(s) Performed: UPPER ESOPHAGEAL ENDOSCOPIC ULTRASOUND (EUS)     Patient location during evaluation: Endoscopy Anesthesia Type: MAC Level of consciousness: awake and alert Pain management: pain level controlled Vital Signs Assessment: post-procedure vital signs reviewed and stable Respiratory status: spontaneous breathing Cardiovascular status: stable Anesthetic complications: no   No notable events documented.  Last Vitals:  Vitals:   07/17/21 0943 07/17/21 0953  BP: (!) 113/54 (!) 111/59  Pulse: 65 62  Resp: 15 18  Temp:    SpO2: 99% 99%    Last Pain:  Vitals:   07/17/21 0953  TempSrc:   PainSc: 0-No pain                 Nolon Nations

## 2021-07-20 ENCOUNTER — Encounter (HOSPITAL_COMMUNITY): Payer: Self-pay | Admitting: Gastroenterology

## 2021-10-13 ENCOUNTER — Ambulatory Visit: Payer: Medicare Other

## 2021-10-13 ENCOUNTER — Other Ambulatory Visit: Payer: Self-pay

## 2021-10-13 DIAGNOSIS — I351 Nonrheumatic aortic (valve) insufficiency: Secondary | ICD-10-CM

## 2021-10-13 DIAGNOSIS — I350 Nonrheumatic aortic (valve) stenosis: Secondary | ICD-10-CM

## 2021-10-16 NOTE — H&P (View-Only) (Signed)
° ° °Patient is here for follow up visit. ° °Subjective:  ° °@Patient ID: Kayla Ward, female    DOB: 10/12/1945, 75 y.o.   MRN: [id number] ° ° °Chief Complaint  °Patient presents with  ° aortic vavle disease  ° Follow-up  ° ° °HPI ° °75-year-old Caucasian female with hyperlipidemia, moderate aortic stenosis and moderate aortic regurgitation. ° °Patient has noticed increasing dyspnea on exertion. She has noticed she gets easily short of breath with climbing upstairs, working with her horses. Recently, one of her horses accidentally hit her in the chest with her head. She is unable to distinguish this chest wall pain from any exertional chest tightness.  She has had lightheadedness episodes with exertion, but has not had any syncope in the recent times. ° °Reviewed recent echocardiogram results with patient, details below. ° °Current Outpatient Medications on File Prior to Visit  °Medication Sig Dispense Refill  ° acetaminophen (TYLENOL) 500 MG tablet Take 1,000 mg by mouth every 6 (six) hours as needed for pain.    ° Ascorbic Acid (VITAMIN C PO) Take 1,000 mg by mouth daily.    ° calcium carbonate (OS-CAL) 1250 (500 Ca) MG chewable tablet Chew 2 tablets by mouth daily.    ° cetirizine (ZYRTEC) 10 MG tablet Take 10 mg by mouth daily as needed for allergies.    ° diclofenac sodium (VOLTAREN) 1 % GEL Apply 2 g topically daily as needed (pain).     ° famotidine-calcium carbonate-magnesium hydroxide (PEPCID COMPLETE) 10-800-165 MG chewable tablet Chew 1 tablet by mouth daily as needed (Heartburn).    ° fluticasone (FLONASE) 50 MCG/ACT nasal spray Place 2 sprays into the nose every 30 (thirty) days.    ° Multiple Vitamin (MULTIVITAMIN WITH MINERALS) TABS tablet Take 1 tablet by mouth daily. [employer] °Gummie    ° Polyvinyl Alcohol-Povidone PF (REFRESH) 1.4-0.6 % SOLN Place 1 drop into both eyes daily.    ° rosuvastatin (CRESTOR) 10 MG tablet Take 1 tablet (10 mg total) by mouth daily. 90 tablet 3  ° °No current  facility-administered medications on file prior to visit.  ° ° °Cardiovascular studies: ° °EKG 10/19/2021: °Sinus rhythm 72 bpm °Nonspecific ST depression  ° °Echocardiogram 10/12/2021:  °Left ventricle cavity is normal in size and wall thickness. Normal global  °wall motion. Normal LV systolic function with EF 65%. Doppler evidence of  °grade I (impaired) diastolic dysfunction, normal LAP.  °Trileaflet aortic valve with moderate aortic valve leaflet calcification.  °Moderate aortic stenosis. Vmax 3.5 m/sec, mean PG 31 mmHg, AVA 0.8 cm² by  °continuity equation, dimensionless index 0.26. Moderate (Grade II) aortic  °regurgitation.  °Mild to moderate mitral regurgitation.  °Mild tricuspid regurgitation.  °No evidence of pulmonary hypertension.  °Previous study on 04/28/2021 reported Vmax 3.3m/s, mean PG  28 mmHg. ° °EKG 04/08/2021: °Sinus rhythm °Short PR interval °Borderline repolarization abnormality °No significant change since last tracing ° °Recent labs: °04/08/2021: °Glucose 110, BUN/Cr 8/0.83. EGFR >60. Na/K 139/4.2. Rest of the CMP normal °H/H 12/37. MCV 91. Platelets 158 °Trop HS 2,3 ° °09/2020: °Chol 164, TG 43, HDL 77, LDL 78 ° ° °10/02/2020: °Chol 164, TG 43, HDL 77, LDL 78 ° °03/27/2020: °Glucose 100, BUN/Cr 7/0.81. EGFR >60. Na/K 142/4.0.  °H/H 13.8/40.4. MCV 93. Platelets 182 ° °10/23/2018: °Chol 173, TG 62, HDL 73, LDL 88 ° ° °Review of Systems  °Cardiovascular:  Positive for chest pain, dyspnea on exertion and syncope. Negative for leg swelling and palpitations.  °Gastrointestinal:  Negative for abdominal   pain, nausea and vomiting.  °Genitourinary:  Positive for dysuria (In 03/2019).  °Neurological:  Positive for light-headedness.  °All other systems reviewed and are negative. ° °   °Objective:  ° ° °Vitals:  ° 10/19/21 1144  °BP: 112/61  °Pulse: 70  °Resp: 16  °Temp: 97.8 °F (36.6 °C)  °SpO2: 99%  ° ° ° Physical Exam °Vitals and nursing note reviewed.  °HENT:  °   Head: Atraumatic.  °Cardiovascular:  °   Rate  and Rhythm: Normal rate and regular rhythm.  °   Pulses: Intact distal pulses.  °   Heart sounds: Murmur heard.  °Harsh midsystolic murmur is present with a grade of 2/6 at the upper right sternal border radiating to the neck.  °High-pitched blowing decrescendo early diastolic murmur is present with a grade of 2/4 at the upper right sternal border radiating to the apex.  °Pulmonary:  °   Effort: Pulmonary effort is normal.  °   Breath sounds: Normal breath sounds. No wheezing or rales.  °Musculoskeletal:  °   Right lower leg: No edema.  °   Left lower leg: No edema.  ° ° ° °   °Assessment & Recommendations:  ° °75-year-old Caucasian female with hyperlipidemia, moderate aortic stenosis and moderate aortic regurgitation. ° °Exertional dyspnea: °Aortic valve stenosis and regurgitation.  Aortic valve area is in the range of severe, but rest of the indices are discrepant.  Nonetheless, her symptoms have clearly worsened with dyspnea on exertion.  Etiology differentials could include aortic valve stenosis/regurgitation, obstructive CAD, or alternate etiology.  Recommend TEE, right and left heart catheterization coronary angiography as work-up for the above.  Her blood pressure is low normal.  Therefore, I am unable to start her on any antianginal treatment, should etiology be obstructive coronary artery disease.  We will decide further management course after the above testing.  If TEE does indeed show severe aortic stenosis, will refer her to TAVR after left and right heart catheterization.  If TEE does not show severe aortic stenosis, will then focus treatment on CAD with medication plus minus PCI. ° °Reviewed risks, benefits, and options for TEE, and return for catheterization.  All questions answered. ° °Mixed hyperlipidemia °Well controlled. ° °F/u in 6 months ° °Ival Basquez J Narmeen Kerper, MD °[facility] °Pager: [phone] °Office: [phone] °If no answer Cell [phone] °   °

## 2021-10-16 NOTE — Progress Notes (Signed)
Patient is here for follow up visit.  Subjective:   _0  ID: Kayla Ward, female    DOB: 1945-12-23, 76 y.o.   MRN: [id number]   Chief Complaint  Patient presents with   aortic vavle disease   Follow-up    HPI  76 year old Caucasian female with hyperlipidemia, moderate aortic stenosis and moderate aortic regurgitation.  Patient has noticed increasing dyspnea on exertion. She has noticed she gets easily short of breath with climbing upstairs, working with her horses. Recently, one of her horses accidentally hit her in the chest with her head. She is unable to distinguish this chest wall pain from any exertional chest tightness.  She has had lightheadedness episodes with exertion, but has not had any syncope in the recent times.  Reviewed recent echocardiogram results with patient, details below.  Current Outpatient Medications on File Prior to Visit  Medication Sig Dispense Refill   acetaminophen (TYLENOL) 500 MG tablet Take 1,000 mg by mouth every 6 (six) hours as needed for pain.     Ascorbic Acid (VITAMIN C PO) Take 1,000 mg by mouth daily.     calcium carbonate (OS-CAL) 1250 (500 Ca) MG chewable tablet Chew 2 tablets by mouth daily.     cetirizine (ZYRTEC) 10 MG tablet Take 10 mg by mouth daily as needed for allergies.     diclofenac sodium (VOLTAREN) 1 % GEL Apply 2 g topically daily as needed (pain).      famotidine-calcium carbonate-magnesium hydroxide (PEPCID COMPLETE) 10-800-165 MG chewable tablet Chew 1 tablet by mouth daily as needed (Heartburn).     fluticasone (FLONASE) 50 MCG/ACT nasal spray Place 2 sprays into the nose every 30 (thirty) days.     Multiple Vitamin (MULTIVITAMIN WITH MINERALS) TABS tablet Take 1 tablet by mouth daily. [employer] Gummie     Polyvinyl Alcohol-Povidone PF (REFRESH) 1.4-0.6 % SOLN Place 1 drop into both eyes daily.     rosuvastatin (CRESTOR) 10 MG tablet Take 1 tablet (10 mg total) by mouth daily. 90 tablet 3   No current  facility-administered medications on file prior to visit.    Cardiovascular studies:  EKG 10/19/2021: Sinus rhythm 72 bpm Nonspecific ST depression   Echocardiogram 10/12/2021:  Left ventricle cavity is normal in size and wall thickness. Normal global  wall motion. Normal LV systolic function with EF 65%. Doppler evidence of  grade I (impaired) diastolic dysfunction, normal LAP.  Trileaflet aortic valve with moderate aortic valve leaflet calcification.  Moderate aortic stenosis. Vmax 3.5 m/sec, mean PG 31 mmHg, AVA 0.8 cm by  continuity equation, dimensionless index 0.26. Moderate (Grade II) aortic  regurgitation.  Mild to moderate mitral regurgitation.  Mild tricuspid regurgitation.  No evidence of pulmonary hypertension.  Previous study on 04/28/2021 reported Vmax 3.61ms, mean PG  28 mmHg.  EKG 04/08/2021: Sinus rhythm Short PR interval Borderline repolarization abnormality No significant change since last tracing  Recent labs: 04/08/2021: Glucose 110, BUN/Cr 8/0.83. EGFR >60. Na/K 139/4.2. Rest of the CMP normal H/H 12/37. MCV 91. Platelets 158 Trop HS 2,3  09/2020: Chol 164, TG 43, HDL 77, LDL 78   10/02/2020: Chol 164, TG 43, HDL 77, LDL 78  03/27/2020: Glucose 100, BUN/Cr 7/0.81. EGFR >60. Na/K 142/4.0.  H/H 13.8/40.4. MCV 93. Platelets 182  10/23/2018: Chol 173, TG 62, HDL 73, LDL 88   Review of Systems  Cardiovascular:  Positive for chest pain, dyspnea on exertion and syncope. Negative for leg swelling and palpitations.  Gastrointestinal:  Negative for abdominal  pain, nausea and vomiting.  Genitourinary:  Positive for dysuria (In 03/2019).  Neurological:  Positive for light-headedness.  All other systems reviewed and are negative.     Objective:    Vitals:   10/19/21 1144  BP: 112/61  Pulse: 70  Resp: 16  Temp: 97.8 F (36.6 C)  SpO2: 99%     Physical Exam Vitals and nursing note reviewed.  HENT:     Head: Atraumatic.  Cardiovascular:     Rate  and Rhythm: Normal rate and regular rhythm.     Pulses: Intact distal pulses.     Heart sounds: Murmur heard.  Harsh midsystolic murmur is present with a grade of 2/6 at the upper right sternal border radiating to the neck.  High-pitched blowing decrescendo early diastolic murmur is present with a grade of 2/4 at the upper right sternal border radiating to the apex.  Pulmonary:     Effort: Pulmonary effort is normal.     Breath sounds: Normal breath sounds. No wheezing or rales.  Musculoskeletal:     Right lower leg: No edema.     Left lower leg: No edema.        Assessment & Recommendations:   76 year old Caucasian female with hyperlipidemia, moderate aortic stenosis and moderate aortic regurgitation.  Exertional dyspnea: Aortic valve stenosis and regurgitation.  Aortic valve area is in the range of severe, but rest of the indices are discrepant.  Nonetheless, her symptoms have clearly worsened with dyspnea on exertion.  Etiology differentials could include aortic valve stenosis/regurgitation, obstructive CAD, or alternate etiology.  Recommend TEE, right and left heart catheterization coronary angiography as work-up for the above.  Her blood pressure is low normal.  Therefore, I am unable to start her on any antianginal treatment, should etiology be obstructive coronary artery disease.  We will decide further management course after the above testing.  If TEE does indeed show severe aortic stenosis, will refer her to TAVR after left and right heart catheterization.  If TEE does not show severe aortic stenosis, will then focus treatment on CAD with medication plus minus PCI.  Reviewed risks, benefits, and options for TEE, and return for catheterization.  All questions answered.  Mixed hyperlipidemia Well controlled.  F/u in 6 months  Ridhima Golberg [name], MD [facility] Pager: [phone] Office: [phone] If no answer Cell [phone]

## 2021-10-19 ENCOUNTER — Encounter: Payer: Self-pay | Admitting: Cardiology

## 2021-10-19 ENCOUNTER — Other Ambulatory Visit: Payer: Self-pay

## 2021-10-19 ENCOUNTER — Ambulatory Visit: Payer: Medicare Other | Admitting: Cardiology

## 2021-10-19 VITALS — BP 112/61 | HR 70 | Temp 97.8°F | Resp 16 | Ht 63.0 in | Wt 124.0 lb

## 2021-10-19 DIAGNOSIS — I351 Nonrheumatic aortic (valve) insufficiency: Secondary | ICD-10-CM

## 2021-10-19 DIAGNOSIS — I350 Nonrheumatic aortic (valve) stenosis: Secondary | ICD-10-CM

## 2021-10-19 DIAGNOSIS — E782 Mixed hyperlipidemia: Secondary | ICD-10-CM

## 2021-10-24 LAB — LIPID PANEL
Chol/HDL Ratio: 2.7 ratio (ref 0.0–4.4)
Cholesterol, Total: 180 mg/dL (ref 100–199)
HDL: 66 mg/dL (ref >39)
LDL Chol Calc (NIH): 97 mg/dL (ref 0–99)
Triglycerides: 95 mg/dL (ref 0–149)
VLDL Cholesterol Cal: 17 mg/dL (ref 5–40)

## 2021-10-24 LAB — CBC
Hematocrit: 42.2 % (ref 34.0–46.6)
Hemoglobin: 14.1 g/dL (ref 11.1–15.9)
MCH: 32 pg (ref 26.6–33.0)
MCHC: 33.4 g/dL (ref 31.5–35.7)
MCV: 96 fL (ref 79–97)
Platelets: 202 10*3/uL (ref 150–450)
RBC: 4.41 x10E6/uL (ref 3.77–5.28)
RDW: 12.7 % (ref 11.7–15.4)
WBC: 3.7 10*3/uL (ref 3.4–10.8)

## 2021-10-24 LAB — BASIC METABOLIC PANEL
BUN/Creatinine Ratio: 13 (ref 12–28)
BUN: 12 mg/dL (ref 8–27)
CO2: 27 mmol/L (ref 20–29)
Calcium: 10 mg/dL (ref 8.7–10.3)
Chloride: 104 mmol/L (ref 96–106)
Creatinine, Ser: 0.95 mg/dL (ref 0.57–1.00)
Glucose: 94 mg/dL (ref 70–99)
Potassium: 5 mmol/L (ref 3.5–5.2)
Sodium: 144 mmol/L (ref 134–144)
eGFR: 62 mL/min/{1.73_m2} (ref >59)

## 2021-11-10 ENCOUNTER — Ambulatory Visit (HOSPITAL_COMMUNITY): Payer: Medicare Other | Admitting: Certified Registered Nurse Anesthetist

## 2021-11-10 ENCOUNTER — Ambulatory Visit (HOSPITAL_BASED_OUTPATIENT_CLINIC_OR_DEPARTMENT_OTHER): Payer: Medicare Other | Admitting: Certified Registered Nurse Anesthetist

## 2021-11-10 ENCOUNTER — Other Ambulatory Visit: Payer: Self-pay

## 2021-11-10 ENCOUNTER — Ambulatory Visit (HOSPITAL_COMMUNITY)
Admission: RE | Admit: 2021-11-10 | Discharge: 2021-11-10 | Disposition: A | Discharge status: Home / Self Care | Payer: Medicare Other | Source: Ambulatory Visit | Attending: Cardiology | Admitting: Cardiology

## 2021-11-10 ENCOUNTER — Encounter (HOSPITAL_COMMUNITY)
Admission: RE | Disposition: A | Discharge status: Home / Self Care | Payer: Self-pay | Source: Home / Self Care | Attending: Cardiology

## 2021-11-10 ENCOUNTER — Encounter (HOSPITAL_COMMUNITY): Payer: Self-pay | Admitting: Cardiology

## 2021-11-10 ENCOUNTER — Ambulatory Visit (HOSPITAL_COMMUNITY)
Admission: RE | Admit: 2021-11-10 | Discharge: 2021-11-10 | Disposition: A | Discharge status: Home / Self Care | Payer: Medicare Other | Attending: Cardiology | Admitting: Cardiology

## 2021-11-10 DIAGNOSIS — E782 Mixed hyperlipidemia: Secondary | ICD-10-CM | POA: Insufficient documentation

## 2021-11-10 DIAGNOSIS — I083 Combined rheumatic disorders of mitral, aortic and tricuspid valves: Secondary | ICD-10-CM

## 2021-11-10 DIAGNOSIS — R0609 Other forms of dyspnea: Secondary | ICD-10-CM | POA: Diagnosis not present

## 2021-11-10 DIAGNOSIS — I351 Nonrheumatic aortic (valve) insufficiency: Secondary | ICD-10-CM | POA: Diagnosis present

## 2021-11-10 DIAGNOSIS — I352 Nonrheumatic aortic (valve) stenosis with insufficiency: Secondary | ICD-10-CM | POA: Diagnosis present

## 2021-11-10 DIAGNOSIS — I350 Nonrheumatic aortic (valve) stenosis: Secondary | ICD-10-CM

## 2021-11-10 HISTORY — PX: RIGHT/LEFT HEART CATH AND CORONARY ANGIOGRAPHY: CATH118266

## 2021-11-10 HISTORY — DX: Cardiac arrhythmia, unspecified: I49.9

## 2021-11-10 HISTORY — DX: Headache, unspecified: R51.9

## 2021-11-10 HISTORY — PX: TEE WITHOUT CARDIOVERSION: SHX5443

## 2021-11-10 LAB — CBC
HCT: 41.1 % (ref 36.0–46.0)
Hemoglobin: 14.4 g/dL (ref 12.0–15.0)
MCH: 32.6 pg (ref 26.0–34.0)
MCHC: 35 g/dL (ref 30.0–36.0)
MCV: 93 fL (ref 80.0–100.0)
Platelets: 176 10*3/uL (ref 150–400)
RBC: 4.42 MIL/uL (ref 3.87–5.11)
RDW: 12.5 % (ref 11.5–15.5)
WBC: 5.8 10*3/uL (ref 4.0–10.5)
nRBC: 0 % (ref 0.0–0.2)

## 2021-11-10 LAB — POCT I-STAT 7, (LYTES, BLD GAS, ICA,H+H)
Acid-Base Excess: 1 mmol/L (ref 0.0–2.0)
Bicarbonate: 27.2 mmol/L (ref 20.0–28.0)
Calcium, Ion: 1.28 mmol/L (ref 1.15–1.40)
HCT: 35 % — ABNORMAL LOW (ref 36.0–46.0)
Hemoglobin: 11.9 g/dL — ABNORMAL LOW (ref 12.0–15.0)
O2 Saturation: 99 %
Potassium: 3.6 mmol/L (ref 3.5–5.1)
Sodium: 143 mmol/L (ref 135–145)
TCO2: 29 mmol/L (ref 22–32)
pCO2 arterial: 48.6 mmHg — ABNORMAL HIGH (ref 32–48)
pH, Arterial: 7.355 (ref 7.35–7.45)
pO2, Arterial: 175 mmHg — ABNORMAL HIGH (ref 83–108)

## 2021-11-10 LAB — BASIC METABOLIC PANEL
Anion gap: 7 (ref 5–15)
BUN: 9 mg/dL (ref 8–23)
CO2: 29 mmol/L (ref 22–32)
Calcium: 9.7 mg/dL (ref 8.9–10.3)
Chloride: 105 mmol/L (ref 98–111)
Creatinine, Ser: 0.89 mg/dL (ref 0.44–1.00)
GFR, Estimated: >60 mL/min (ref >60)
Glucose, Bld: 97 mg/dL (ref 70–99)
Potassium: 4 mmol/L (ref 3.5–5.1)
Sodium: 141 mmol/L (ref 135–145)

## 2021-11-10 SURGERY — ECHOCARDIOGRAM, TRANSESOPHAGEAL
Anesthesia: Monitor Anesthesia Care

## 2021-11-10 SURGERY — RIGHT/LEFT HEART CATH AND CORONARY ANGIOGRAPHY
Anesthesia: LOCAL

## 2021-11-10 MED ORDER — SODIUM CHLORIDE 0.9 % WEIGHT BASED INFUSION: 1.0000 mL/kg/h | INTRAVENOUS | Status: DC

## 2021-11-10 MED ORDER — MIDAZOLAM HCL 2 MG/2ML IJ SOLN
INTRAMUSCULAR | Status: DC | PRN
Start: 2021-11-10 — End: 2021-11-10
  Administered 2021-11-10: 1 mg via INTRAVENOUS

## 2021-11-10 MED ORDER — HEPARIN SODIUM (PORCINE) 1000 UNIT/ML IJ SOLN
INTRAMUSCULAR | Status: AC
  Filled 2021-11-10: qty 10

## 2021-11-10 MED ORDER — SODIUM CHLORIDE 0.9 % IV SOLN
INTRAVENOUS | Status: DC | PRN
Start: 2021-11-10 — End: 2021-11-10

## 2021-11-10 MED ORDER — SODIUM CHLORIDE 0.9 % IV SOLN
250.0000 mL | INTRAVENOUS | Status: DC | PRN
  Administered 2021-11-10: 250 mL via INTRAVENOUS

## 2021-11-10 MED ORDER — SODIUM CHLORIDE 0.9 % WEIGHT BASED INFUSION: 3.0000 mL/kg/h | INTRAVENOUS | Status: AC

## 2021-11-10 MED ORDER — LIDOCAINE HCL (PF) 1 % IJ SOLN
INTRAMUSCULAR | Status: AC
  Filled 2021-11-10: qty 30

## 2021-11-10 MED ORDER — SODIUM CHLORIDE 0.9% FLUSH: 3.0000 mL | Freq: Two times a day (BID) | INTRAVENOUS | Status: DC

## 2021-11-10 MED ORDER — PROPOFOL 500 MG/50ML IV EMUL
INTRAVENOUS | Status: DC | PRN
  Administered 2021-11-10: 75 ug/kg/min via INTRAVENOUS

## 2021-11-10 MED ORDER — LIDOCAINE HCL (PF) 1 % IJ SOLN
INTRAMUSCULAR | Status: DC | PRN
Start: 2021-11-10 — End: 2021-11-10
  Administered 2021-11-10 (×2): 2 mL

## 2021-11-10 MED ORDER — ACETAMINOPHEN 325 MG PO TABS: 650.0000 mg | ORAL_TABLET | ORAL | Status: DC | PRN

## 2021-11-10 MED ORDER — HEPARIN (PORCINE) IN NACL 1000-0.9 UT/500ML-% IV SOLN
INTRAVENOUS | Status: AC
  Filled 2021-11-10: qty 1000

## 2021-11-10 MED ORDER — PROPOFOL 10 MG/ML IV BOLUS
INTRAVENOUS | Status: DC | PRN
  Administered 2021-11-10: 20 mg via INTRAVENOUS

## 2021-11-10 MED ORDER — MIDAZOLAM HCL 2 MG/2ML IJ SOLN
INTRAMUSCULAR | Status: AC
  Filled 2021-11-10: qty 2

## 2021-11-10 MED ORDER — SODIUM CHLORIDE 0.9% FLUSH: 3.0000 mL | INTRAVENOUS | Status: DC | PRN

## 2021-11-10 MED ORDER — HEPARIN (PORCINE) IN NACL 1000-0.9 UT/500ML-% IV SOLN
INTRAVENOUS | Status: DC | PRN
  Administered 2021-11-10 (×2): 500 mL

## 2021-11-10 MED ORDER — FENTANYL CITRATE (PF) 100 MCG/2ML IJ SOLN
INTRAMUSCULAR | Status: AC
  Filled 2021-11-10: qty 2

## 2021-11-10 MED ORDER — SODIUM CHLORIDE 0.9 % IV SOLN: INTRAVENOUS | Status: DC

## 2021-11-10 MED ORDER — IOHEXOL 350 MG/ML SOLN
INTRAVENOUS | Status: DC | PRN
  Administered 2021-11-10: 30 mL via INTRA_ARTERIAL

## 2021-11-10 MED ORDER — VERAPAMIL HCL 2.5 MG/ML IV SOLN
INTRAVENOUS | Status: DC | PRN
  Administered 2021-11-10: 10 mL via INTRA_ARTERIAL

## 2021-11-10 MED ORDER — SODIUM CHLORIDE 0.9 % IV SOLN: 250.0000 mL | INTRAVENOUS | Status: DC | PRN

## 2021-11-10 MED ORDER — VERAPAMIL HCL 2.5 MG/ML IV SOLN
INTRAVENOUS | Status: AC
  Filled 2021-11-10: qty 2

## 2021-11-10 MED ORDER — HYDRALAZINE HCL 20 MG/ML IJ SOLN: 10.0000 mg | INTRAMUSCULAR | Status: DC | PRN

## 2021-11-10 MED ORDER — ONDANSETRON HCL 4 MG/2ML IJ SOLN: 4.0000 mg | Freq: Four times a day (QID) | INTRAMUSCULAR | Status: DC | PRN

## 2021-11-10 MED ORDER — LABETALOL HCL 5 MG/ML IV SOLN: 10.0000 mg | INTRAVENOUS | Status: DC | PRN

## 2021-11-10 MED ORDER — ASPIRIN 81 MG PO CHEW: 81.0000 mg | CHEWABLE_TABLET | ORAL | Status: DC

## 2021-11-10 MED ORDER — HEPARIN SODIUM (PORCINE) 1000 UNIT/ML IJ SOLN
INTRAMUSCULAR | Status: DC | PRN
  Administered 2021-11-10: 3000 [IU] via INTRAVENOUS

## 2021-11-10 MED ORDER — FENTANYL CITRATE (PF) 100 MCG/2ML IJ SOLN
INTRAMUSCULAR | Status: DC | PRN
  Administered 2021-11-10: 25 ug via INTRAVENOUS

## 2021-11-10 SURGICAL SUPPLY — 13 items
CATH BALLN WEDGE 5F 110CM (CATHETERS) ×1 IMPLANT
CATH OPTITORQUE TIG 4.0 5F (CATHETERS) ×1 IMPLANT
GLIDESHEATH SLEND A-KIT 6F 22G (SHEATH) ×1 IMPLANT
GUIDEWIRE .025 260CM (WIRE) ×1 IMPLANT
GUIDEWIRE INQWIRE 1.5J.035X260 (WIRE) IMPLANT
INQWIRE 1.5J .035X260CM (WIRE) ×2
KIT HEART LEFT (KITS) ×2 IMPLANT
PACK CARDIAC CATHETERIZATION (CUSTOM PROCEDURE TRAY) ×2 IMPLANT
SHEATH GLIDE SLENDER 4/5FR (SHEATH) ×1 IMPLANT
SHEATH PROBE COVER 6X72 (BAG) ×1 IMPLANT
TRANSDUCER W/STOPCOCK (MISCELLANEOUS) ×2 IMPLANT
TUBING CIL FLEX 10 FLL-RA (TUBING) ×2 IMPLANT
WIRE MICROINTRODUCER 60CM (WIRE) ×1 IMPLANT

## 2021-11-10 NOTE — Interval H&P Note (Signed)
History and Physical Interval Note:  11/10/2021 2:13 PM  Kayla Ward  has presented today for surgery, with the diagnosis of aortic stenosis.  The various methods of treatment have been discussed with the patient and family. After consideration of risks, benefits and other options for treatment, the patient has consented to  Procedure(s): RIGHT/LEFT HEART CATH AND CORONARY ANGIOGRAPHY (N/A) as a surgical intervention.  The patient's history has been reviewed, patient examined, no change in status, stable for surgery.  I have reviewed the patient's chart and labs.  Questions were answered to the patient's satisfaction.    2012 Appropriate Use Criteria for Diagnostic Catheterization Valvular Disease (Right and Left Heart Catheterization or Right Heart Catheterization Alone With or Without Left Ventriculography and Coronary Angiography) Indication:  Chronic Native or Prosthetic Valvular Disease Symptomatic Related to Valvular Disease Equivocal aortic stenosis/low gradient aortic stenosis May include pharmacological challenge (e.g., dobutamine) Noninvasive Imaging for Valvular Disease Conflicting With Clinical Impression of Severity A (8) Indication: 87; Score 8 273 Indication:  Chronic Native or Prosthetic Valvular Disease Symptomatic Related to Valvular Disease Mild or moderate aortic regurgitation Noninvasive Imaging for Valvular Disease Conflicting With Clinical Impression of Severity A (7) Indication: 88; Score 7 271  Kayla Ward Kayla Ward

## 2021-11-10 NOTE — Anesthesia Procedure Notes (Signed)
Procedure Name: MAC Date/Time: 11/10/2021 9:55 AM Performed by: Harden Mo, CRNA Pre-anesthesia Checklist: Patient identified, Emergency Drugs available, Suction available and Patient being monitored Patient Re-evaluated:Patient Re-evaluated prior to induction Oxygen Delivery Method: Nasal cannula Preoxygenation: Pre-oxygenation with 100% oxygen Induction Type: IV induction Placement Confirmation: positive ETCO2 and breath sounds checked- equal and bilateral Dental Injury: Teeth and Oropharynx as per pre-operative assessment

## 2021-11-10 NOTE — Progress Notes (Signed)
°  Echocardiogram Echocardiogram Transesophageal has been performed.  Kayla Ward 11/10/2021, 10:45 AM

## 2021-11-10 NOTE — CV Procedure (Signed)
TEE performed under MAC anesthesia 11/10/2021:  LV: Normal LV systolic function.  LVH. LA: Dilated.  No left atrial appendage clot.  Interatrial septum is intact by color flow Doppler. RV: Normal. RA: Normal. Mitral valve: Normal.  Trace MR. Tricuspid valve: Normal.  Trace PR. Pulmonary valve: Normal.  Trace PI. Aortic valve: Moderate calcification of the aortic valve.  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  Mild restricted movement of all 3 cusps, moderate aortic stenosis by pressure gradient criteria, mild aortic stenosis by continuity equation and planimetry.  Aortic valve area measured to be 1.1 cm.   Moderate aortic regurgitation.  Central jet.   Aorta: No evidence of aortic aneurysm.  Mild atheromatous changes noted in the entire thoracic aorta. This is a preliminary report.

## 2021-11-10 NOTE — Anesthesia Postprocedure Evaluation (Signed)
Anesthesia Post Note  Patient: Kayla Ward  Procedure(s) Performed: TRANSESOPHAGEAL ECHOCARDIOGRAM (TEE)     Patient location during evaluation: Endoscopy Anesthesia Type: MAC Level of consciousness: awake and alert Pain management: pain level controlled Vital Signs Assessment: post-procedure vital signs reviewed and stable Respiratory status: spontaneous breathing, nonlabored ventilation and respiratory function stable Cardiovascular status: blood pressure returned to baseline and stable Postop Assessment: no apparent nausea or vomiting Anesthetic complications: no   No notable events documented.  Last Vitals:  Vitals:   11/10/21 1040 11/10/21 1050  BP: 105/86 98/76  Pulse: 67 65  Resp: 15 12  Temp:    SpO2: 100% 100%    Last Pain:  Vitals:   11/10/21 1040  TempSrc:   PainSc: 0-No pain                 Lidia Collum

## 2021-11-10 NOTE — Transfer of Care (Signed)
Immediate Anesthesia Transfer of Care Note  Patient: Kayla Ward  Procedure(s) Performed: TRANSESOPHAGEAL ECHOCARDIOGRAM (TEE)  Patient Location: Endoscopy Unit  Anesthesia Type:MAC  Level of Consciousness: awake and alert   Airway & Oxygen Therapy: Patient Spontanous Breathing  Post-op Assessment: Report given to RN and Post -op Vital signs reviewed and stable  Post vital signs: Reviewed and stable  Last Vitals:  Vitals Value Taken Time  BP 95/52 11/10/21 1030  Temp    Pulse 67 11/10/21 1030  Resp 21 11/10/21 1030  SpO2 98 % 11/10/21 1030  Vitals shown include unvalidated device data.  Last Pain:  Vitals:   11/10/21 1030  TempSrc:   PainSc: 0-No pain         Complications: No notable events documented.

## 2021-11-10 NOTE — Interval H&P Note (Signed)
History and Physical Interval Note:  11/10/2021 9:54 AM  Kayla Ward  has presented today for surgery, with the diagnosis of AORTIC VALVE STENOSIS.  The various methods of treatment have been discussed with the patient and family. After consideration of risks, benefits and other options for treatment, the patient has consented to  Procedure(s): TRANSESOPHAGEAL ECHOCARDIOGRAM (TEE) (N/A) as a surgical intervention.  The patient's history has been reviewed, patient examined, no change in status, stable for surgery.  I have reviewed the patient's chart and labs.  Questions were answered to the patient's satisfaction.     Yates Decamp

## 2021-11-10 NOTE — Discharge Instructions (Addendum)

## 2021-11-10 NOTE — Anesthesia Preprocedure Evaluation (Addendum)
Anesthesia Evaluation  Patient identified by MRN, date of birth, ID band Patient awake    Reviewed: Allergy & Precautions, NPO status , Patient's Chart, lab work & pertinent test results  History of Anesthesia Complications Negative for: history of anesthetic complications  Airway Mallampati: II  TM Distance: >3 FB Neck ROM: Full    Dental  (+) Teeth Intact   Pulmonary neg pulmonary ROS,    Pulmonary exam normal        Cardiovascular Normal cardiovascular exam+ Valvular Problems/Murmurs AS and AI    Echo 10/13/21: EF 65%, g1dd, mod AS (AVA 0.8, MG 31), mod AR, mild-mod MR, mild TR   Neuro/Psych negative neurological ROS     GI/Hepatic negative GI ROS, Neg liver ROS,   Endo/Other  negative endocrine ROS  Renal/GU negative Renal ROS  negative genitourinary   Musculoskeletal negative musculoskeletal ROS (+)   Abdominal   Peds  Hematology negative hematology ROS (+)   Anesthesia Other Findings   Reproductive/Obstetrics                            Anesthesia Physical Anesthesia Plan  ASA: 3  Anesthesia Plan: MAC   Post-op Pain Management: Minimal or no pain anticipated   Induction: Intravenous  PONV Risk Score and Plan: 2 and Propofol infusion, TIVA and Treatment may vary due to age or medical condition  Airway Management Planned: Natural Airway, Nasal Cannula and Simple Face Mask  Additional Equipment: None  Intra-op Plan:   Post-operative Plan:   Informed Consent: I have reviewed the patients History and Physical, chart, labs and discussed the procedure including the risks, benefits and alternatives for the proposed anesthesia with the patient or authorized representative who has indicated his/her understanding and acceptance.       Plan Discussed with:   Anesthesia Plan Comments:         Anesthesia Quick Evaluation

## 2021-11-11 ENCOUNTER — Encounter (HOSPITAL_COMMUNITY): Payer: Self-pay | Admitting: Cardiology

## 2021-11-11 LAB — POCT I-STAT EG7
Acid-Base Excess: 0 mmol/L (ref 0.0–2.0)
Acid-Base Excess: 0 mmol/L (ref 0.0–2.0)
Bicarbonate: 25.8 mmol/L (ref 20.0–28.0)
Bicarbonate: 26.2 mmol/L (ref 20.0–28.0)
Calcium, Ion: 1.19 mmol/L (ref 1.15–1.40)
Calcium, Ion: 1.22 mmol/L (ref 1.15–1.40)
HCT: 34 % — ABNORMAL LOW (ref 36.0–46.0)
HCT: 34 % — ABNORMAL LOW (ref 36.0–46.0)
Hemoglobin: 11.6 g/dL — ABNORMAL LOW (ref 12.0–15.0)
Hemoglobin: 11.6 g/dL — ABNORMAL LOW (ref 12.0–15.0)
O2 Saturation: 74 %
O2 Saturation: 78 %
Potassium: 3.5 mmol/L (ref 3.5–5.1)
Potassium: 3.5 mmol/L (ref 3.5–5.1)
Sodium: 144 mmol/L (ref 135–145)
Sodium: 144 mmol/L (ref 135–145)
TCO2: 27 mmol/L (ref 22–32)
TCO2: 28 mmol/L (ref 22–32)
pCO2, Ven: 48.1 mmHg (ref 44–60)
pCO2, Ven: 48.5 mmHg (ref 44–60)
pH, Ven: 7.337 (ref 7.25–7.43)
pH, Ven: 7.34 (ref 7.25–7.43)
pO2, Ven: 43 mmHg (ref 32–45)
pO2, Ven: 45 mmHg (ref 32–45)

## 2021-11-12 LAB — ECHO TEE
AR max vel: 0.03 cm2
AV Area VTI: 0.82 cm2
AV Area mean vel: 1.33 cm2
AV Mean grad: 20.3 mmHg
AV Peak grad: 63302.6 mmHg
Ao pk vel: 125.8 m/s
P 1/2 time: 321 msec

## 2021-11-26 ENCOUNTER — Encounter: Payer: Self-pay | Admitting: Cardiology

## 2021-11-26 ENCOUNTER — Ambulatory Visit: Payer: Medicare Other | Admitting: Cardiology

## 2021-11-26 ENCOUNTER — Other Ambulatory Visit: Payer: Self-pay

## 2021-11-26 VITALS — BP 120/62 | HR 79 | Temp 97.8°F | Resp 16 | Ht 63.0 in | Wt 126.0 lb

## 2021-11-26 DIAGNOSIS — I350 Nonrheumatic aortic (valve) stenosis: Secondary | ICD-10-CM

## 2021-11-26 DIAGNOSIS — I351 Nonrheumatic aortic (valve) insufficiency: Secondary | ICD-10-CM

## 2021-11-26 DIAGNOSIS — E782 Mixed hyperlipidemia: Secondary | ICD-10-CM

## 2021-11-26 NOTE — Progress Notes (Signed)
? ? ?Patient is here for follow up visit. ? ?Subjective:  ? ?'@Patient'  ID: Kayla Ward, female    DOB: 11-Oct-1945, 76 y.o.   MRN: [id number] ? ? ?Chief Complaint  ?Patient presents with  ? Nonrheumatic aortic valve insufficiency  ? Follow-up  ? ? ?HPI ? ?76 year old Caucasian female with hyperlipidemia, moderate aortic stenosis and moderate aortic regurgitation. ? ?Patient underwent TEE and heart catheterization, that ruled out severe areas of severe AI, coronary artery disease.  Patient is doing fairly well.  She has not had any recurrent dizziness symptoms.  Exertional dyspnea is mild and stable. ? ? ?Current Outpatient Medications:  ?  acetaminophen (TYLENOL) 500 MG tablet, Take 1,000 mg by mouth every 6 (six) hours as needed for pain., Disp: , Rfl:  ?  Ascorbic Acid (VITAMIN C) 1000 MG tablet, Take 1,000 mg by mouth daily., Disp: , Rfl:  ?  Calcium-Vitamin D-Vitamin K (CALCIUM + D + K PO), Take 2 tablets by mouth daily., Disp: , Rfl:  ?  carboxymethylcellulose (REFRESH PLUS) 0.5 % SOLN, Place 1 drop into both eyes daily., Disp: , Rfl:  ?  cetirizine (ZYRTEC) 10 MG tablet, Take 10 mg by mouth daily as needed for allergies., Disp: , Rfl:  ?  diclofenac sodium (VOLTAREN) 1 % GEL, Apply 2 g topically daily as needed (pain). , Disp: , Rfl:  ?  fluticasone (FLONASE) 50 MCG/ACT nasal spray, Place 2 sprays into the nose daily as needed for allergies., Disp: , Rfl:  ?  Multiple Vitamin (MULTIVITAMIN WITH MINERALS) TABS tablet, Take 1 tablet by mouth daily. Woman's Alive Gummie, Disp: , Rfl:  ?  rosuvastatin (CRESTOR) 10 MG tablet, Take 1 tablet (10 mg total) by mouth daily., Disp: 90 tablet, Rfl: 3 ? ?Cardiovascular studies: ? ?LHC/RHC 11/10/2021: ?Normal coronary arteries ?Normal filling pressures ?Consider noncardiac etiology for exertional dyspnea ? ?TEE 11/10/2021: ? 1. Left ventricular ejection fraction, by estimation, is 60 to 65%. The  ?left ventricle has normal function. The left ventricle has no regional  ?wall  motion abnormalities.  ? 2. Right ventricular systolic function is normal. The right ventricular  ?size is normal.  ? 3. Left atrial size was mildly dilated. No left atrial/left atrial  ?appendage thrombus was detected.  ? 4. The mitral valve is normal in structure. Trivial mitral valve  ?regurgitation. No evidence of mitral stenosis.  ? 5. 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 By planimetry, AVA 1.06 cm2. The aortic valve is calcified. There  ?is moderate calcification of the aortic valve. There is mild thickening of  ?the aortic valve. Aortic valve  ?regurgitation is moderate. Mild to moderate aortic valve stenosis. Aortic  ?valve area, by VTI measures 0.82 cm?Marland Kitchen Aortic valve mean gradient measures  ?20.3 mmHg. Aortic valve Vmax measures 125.80 m/s.  ? ?EKG 10/19/2021: ?Sinus rhythm 72 bpm ?Nonspecific ST depression  ? ?Echocardiogram 10/12/2021:  ?Left ventricle cavity is normal in size and wall thickness. Normal global  ?wall motion. Normal LV systolic function with EF 65%. Doppler evidence of  ?grade I (impaired) diastolic dysfunction, normal LAP.  ?Trileaflet aortic valve with moderate aortic valve leaflet calcification.  ?Moderate aortic stenosis. Vmax 3.5 m/sec, mean PG 31 mmHg, AVA 0.8 cm? by  ?continuity equation, dimensionless index 0.26. Moderate (Grade II) aortic  ?regurgitation.  ?Mild to moderate mitral regurgitation.  ?Mild tricuspid regurgitation.  ?No evidence of pulmonary hypertension.  ?Previous study on 04/28/2021 reported Vmax 3.99ms, mean PG  28 mmHg. ? ?  EKG 04/08/2021: ?Sinus rhythm ?Short PR interval ?Borderline repolarization abnormality ?No significant change since last tracing ? ?Recent labs: ?04/08/2021: ?Glucose 110, BUN/Cr 8/0.83. EGFR >60. Na/K 139/4.2. Rest of the CMP normal ?H/H 12/37. MCV 91. Platelets 158 ?Trop HS 2,3 ? ?09/2020: ?Chol 164, TG 43, HDL 77, LDL 78 ? ? ?10/02/2020: ?Chol 164, TG 43, HDL 77, LDL 78 ? ?03/27/2020: ?Glucose 100, BUN/Cr  7/0.81. EGFR >60. Na/K 142/4.0.  ?H/H 13.8/40.4. MCV 93. Platelets 182 ? ?10/23/2018: ?Chol 173, TG 62, HDL 73, LDL 88 ? ? ?Review of Systems  ?Cardiovascular:  Positive for chest pain, dyspnea on exertion and syncope. Negative for leg swelling and palpitations.  ?Gastrointestinal:  Negative for abdominal pain, nausea and vomiting.  ?Genitourinary:  Positive for dysuria (In 03/2019).  ?Neurological:  Positive for light-headedness.  ?All other systems reviewed and are negative. ? ?   ?Objective:  ? ? ?Vitals:  ? 11/26/21 1124  ?BP: 120/62  ?Pulse: 79  ?Resp: 16  ?Temp: 97.8 ?F (36.6 ?C)  ?SpO2: 97%  ? ? ? Physical Exam ?Vitals and nursing note reviewed.  ?HENT:  ?   Head: Atraumatic.  ?Cardiovascular:  ?   Rate and Rhythm: Normal rate and regular rhythm.  ?   Pulses: Intact distal pulses.  ?   Heart sounds: Murmur heard.  ?Harsh midsystolic murmur is present with a grade of 2/6 at the upper right sternal border radiating to the neck.  ?High-pitched blowing decrescendo early diastolic murmur is present with a grade of 2/4 at the upper right sternal border radiating to the apex.  ?Pulmonary:  ?   Effort: Pulmonary effort is normal.  ?   Breath sounds: Normal breath sounds. No wheezing or rales.  ?Musculoskeletal:  ?   Right lower leg: No edema.  ?   Left lower leg: No edema.  ? ? ?  ICD-10-CM   ?1. Nonrheumatic aortic valve insufficiency  I35.1 PCV ECHOCARDIOGRAM COMPLETE  ?  ?2. Nonrheumatic aortic valve stenosis  I35.0 PCV ECHOCARDIOGRAM COMPLETE  ?  ?3. Mixed hyperlipidemia  E78.2   ?  ? ?Orders Placed This Encounter  ?Procedures  ? PCV ECHOCARDIOGRAM COMPLETE  ? ? ? ?   ?Assessment & Recommendations:  ? ?76 year old Caucasian female with hyperlipidemia, moderate aortic stenosis and moderate aortic regurgitation. ? ?Moderate AS/moderate AI: ?Repeat transthoracic echocardiogram 1 year. ? ?Mixed hyperlipidemia ?Well controlled. ? ?F/u in 1 year  ? ?Nigel Mormon, MD ?[facility] ?Pager:  [phone] ?Office: [phone] ?If no answer Cell [phone] ?   ?

## 2021-12-06 ENCOUNTER — Other Ambulatory Visit: Payer: Self-pay | Admitting: Cardiology

## 2021-12-06 DIAGNOSIS — E782 Mixed hyperlipidemia: Secondary | ICD-10-CM

## 2022-04-15 ENCOUNTER — Other Ambulatory Visit: Payer: Self-pay | Admitting: Family Medicine

## 2022-04-15 DIAGNOSIS — Z780 Asymptomatic menopausal state: Secondary | ICD-10-CM

## 2022-04-15 DIAGNOSIS — M85852 Other specified disorders of bone density and structure, left thigh: Secondary | ICD-10-CM

## 2022-04-15 DIAGNOSIS — Z1231 Encounter for screening mammogram for malignant neoplasm of breast: Secondary | ICD-10-CM

## 2022-10-08 ENCOUNTER — Ambulatory Visit
Admission: RE | Admit: 2022-10-08 | Discharge: 2022-10-08 | Disposition: A | Discharge status: Home / Self Care | Payer: Medicare Other | Source: Ambulatory Visit | Attending: Family Medicine | Admitting: Family Medicine

## 2022-10-08 DIAGNOSIS — Z780 Asymptomatic menopausal state: Secondary | ICD-10-CM

## 2022-10-08 DIAGNOSIS — Z1231 Encounter for screening mammogram for malignant neoplasm of breast: Secondary | ICD-10-CM

## 2022-10-08 DIAGNOSIS — M85852 Other specified disorders of bone density and structure, left thigh: Secondary | ICD-10-CM

## 2022-11-15 ENCOUNTER — Ambulatory Visit: Payer: Medicare Other

## 2022-11-15 DIAGNOSIS — I351 Nonrheumatic aortic (valve) insufficiency: Secondary | ICD-10-CM

## 2022-11-15 DIAGNOSIS — I350 Nonrheumatic aortic (valve) stenosis: Secondary | ICD-10-CM

## 2022-11-26 ENCOUNTER — Encounter: Payer: Self-pay | Admitting: Cardiology

## 2022-11-26 ENCOUNTER — Ambulatory Visit: Payer: Medicare Other | Admitting: Cardiology

## 2022-11-26 VITALS — BP 119/60 | HR 68 | Ht 63.0 in | Wt 126.8 lb

## 2022-11-26 DIAGNOSIS — E782 Mixed hyperlipidemia: Secondary | ICD-10-CM

## 2022-11-26 DIAGNOSIS — I351 Nonrheumatic aortic (valve) insufficiency: Secondary | ICD-10-CM

## 2022-11-26 MED ORDER — ROSUVASTATIN CALCIUM 10 MG PO TABS: 10.0000 mg | ORAL_TABLET | Freq: Every day | ORAL | 3 refills | Status: DC

## 2022-11-26 NOTE — Progress Notes (Signed)
Patient is here for follow up visit.  Subjective:   '@Patient'$  ID: Kayla Ward, female    DOB: October 17, 1945, 77 y.o.   MRN: [id number]   Chief Complaint  Patient presents with   Nonrheumatic aortic valve insufficiency   Follow-up    HPI  77 year old Caucasian female with hyperlipidemia, moderate aortic stenosis and moderate aortic regurgitation.  Patient is doing well, denies any complaints. In October 2023, she had an episode of palpitations with shortness of breath during a regular hike. She has not had similar complaints since then.    Current Outpatient Medications:    acetaminophen (TYLENOL) 500 MG tablet, Take 1,000 mg by mouth every 6 (six) hours as needed for pain., Disp: , Rfl:    Ascorbic Acid (VITAMIN C) 1000 MG tablet, Take 1,000 mg by mouth daily., Disp: , Rfl:    Calcium-Vitamin D-Vitamin K (CALCIUM + D + K PO), Take 2 tablets by mouth daily., Disp: , Rfl:    carboxymethylcellulose (REFRESH PLUS) 0.5 % SOLN, Place 1 drop into both eyes daily., Disp: , Rfl:    cetirizine (ZYRTEC) 10 MG tablet, Take 10 mg by mouth daily as needed for allergies., Disp: , Rfl:    diclofenac sodium (VOLTAREN) 1 % GEL, Apply 2 g topically daily as needed (pain). , Disp: , Rfl:    fluticasone (FLONASE) 50 MCG/ACT nasal spray, Place 2 sprays into the nose daily as needed for allergies., Disp: , Rfl:    Multiple Vitamin (MULTIVITAMIN WITH MINERALS) TABS tablet, Take 1 tablet by mouth daily. Woman's Alive Gummie, Disp: , Rfl:    rosuvastatin (CRESTOR) 10 MG tablet, TAKE ONE TABLET BY MOUTH DAILY, Disp: 90 tablet, Rfl: 3  Cardiovascular studies:  EKG 11/26/2022: Sinus rhythm 67 bpm Normal EKG  Echocardiogram 11/15/2022: Left ventricle cavity is normal in size and wall thickness. Normal global wall motion. Normal LV systolic function with EF 70%. Doppler evidence of grade I (impaired) diastolic dysfunction, normal LAP. Trileaflet aortic valve with moderate aortic valve leaflet  calcification. Moderate aortic stenosis. Vmax 3.2 m/sec, mean PG 23 mmHg, AVA 0.8 cm by continuity equation. Dimensionless index 0.28. Moderate (Grade II) aortic regurgitation. Mild to moderate tricuspid regurgitation. No evidence of pulmonary hypertension. No significant change compared to previous study on 10/13/2021.   LHC/RHC 11/10/2021: Normal coronary arteries Normal filling pressures Consider noncardiac etiology for exertional dyspnea  TEE 11/10/2021:  1. Left ventricular ejection fraction, by estimation, is 60 to 65%. The  left ventricle has normal function. The left ventricle has no regional  wall motion abnormalities.   2. Right ventricular systolic function is normal. The right ventricular  size is normal.   3. Left atrial size was mildly dilated. No left atrial/left atrial  appendage thrombus was detected.   4. The mitral valve is normal in structure. Trivial mitral valve  regurgitation. No evidence of mitral stenosis.   5. Functionally bicuspid AV with partially fused left and right coronary  cusps. By planimetry, AVA 1.06 cm2. The aortic valve is calcified. There  is moderate calcification of the aortic valve. There is mild thickening of  the aortic valve. Aortic valve  regurgitation is moderate. Mild to moderate aortic valve stenosis. Aortic  valve area, by VTI measures 0.82 cm. Aortic valve mean gradient measures  20.3 mmHg. Aortic valve Vmax measures 125.80 m/s.   Recent labs: 07/16/2022: H/H 12/36. MCV 93. Platelets 223 TSH 5.4 high   04/08/2021: Glucose 110, BUN/Cr 8/0.83. EGFR >60. Na/K 139/4.2. Rest of the CMP  normal H/H 12/37. MCV 91. Platelets 158 Trop HS 2,3  09/2020: Chol 164, TG 43, HDL 77, LDL 78   10/02/2020: Chol 164, TG 43, HDL 77, LDL 78  03/27/2020: Glucose 100, BUN/Cr 7/0.81. EGFR >60. Na/K 142/4.0.  H/H 13.8/40.4. MCV 93. Platelets 182  10/23/2018: Chol 173, TG 62, HDL 73, LDL 88   Review of Systems  Cardiovascular:  Positive for  syncope. Negative for chest pain, dyspnea on exertion, leg swelling and palpitations.  Gastrointestinal:  Negative for abdominal pain, nausea and vomiting.  Genitourinary:  Positive for dysuria (In 03/2019).  Neurological:  Positive for light-headedness.  All other systems reviewed and are negative.      Objective:    Vitals:   11/26/22 1322  BP: 119/60  Pulse: 68  SpO2: 97%     Physical Exam Vitals and nursing note reviewed.  HENT:     Head: Atraumatic.  Cardiovascular:     Rate and Rhythm: Normal rate and regular rhythm.     Pulses: Intact distal pulses.     Heart sounds: Murmur heard.     Harsh midsystolic murmur is present with a grade of 2/6 at the upper right sternal border radiating to the neck.     High-pitched blowing decrescendo early diastolic murmur is present with a grade of 2/4 at the upper right sternal border radiating to the apex.  Pulmonary:     Effort: Pulmonary effort is normal.     Breath sounds: Normal breath sounds. No wheezing or rales.  Musculoskeletal:     Right lower leg: No edema.     Left lower leg: No edema.       ICD-10-CM   1. Nonrheumatic aortic valve insufficiency  I35.1 EKG 12-Lead    PCV ECHOCARDIOGRAM COMPLETE    2. Mixed hyperlipidemia  E78.2 rosuvastatin (CRESTOR) 10 MG tablet     Orders Placed This Encounter  Procedures   EKG 12-Lead   PCV ECHOCARDIOGRAM COMPLETE        Assessment & Recommendations:   77 year old Caucasian female with hyperlipidemia, moderate aortic stenosis and moderate aortic regurgitation.  Palpitation: No recurrence since 06/2022. If she has recurrence, will recommend cardiac telemetry.   Moderate AS/moderate AI: Stable. Repeat transthoracic echocardiogram 1 year.  Mixed hyperlipidemia Well controlled.  F/u in 1 year    Nigel Mormon, MD Pager: [phone] Office: [phone]

## 2022-11-29 ENCOUNTER — Ambulatory Visit: Payer: Medicare Other | Admitting: Cardiology

## 2023-03-04 IMAGING — CT CT HEAD W/O CM
4 series · 16 of 47 positions shown, 18 images · non-contrast
Comparison: Head CT 03/28/2019

CLINICAL DATA: Patient fell this morning in the bathroom after
syncope. Struck head on floor with posterior scalp laceration.

EXAM:
CT HEAD WITHOUT CONTRAST
CT CERVICAL SPINE WITHOUT CONTRAST
TECHNIQUE: Multidetector CT imaging of the head and cervical spine was
performed following the standard protocol without intravenous
contrast. Multiplanar CT image reconstructions of the cervical spine
were also generated.

[Series 2: head wo · axial · 0.41mm/px · z∈[-301,-191]mm · 7 of 30 slices shown, 9 images]
[im 4/30  brain]
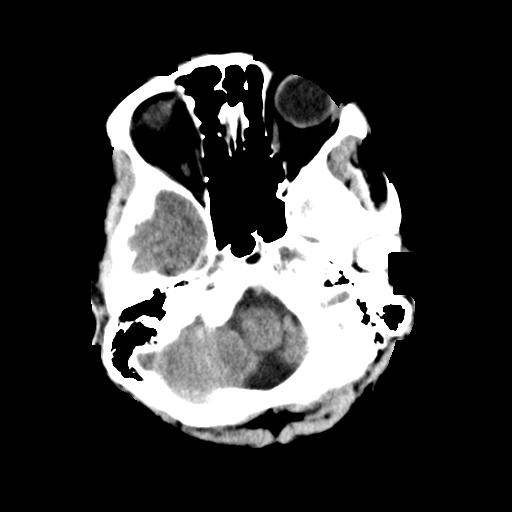
[im 4/30  bone]
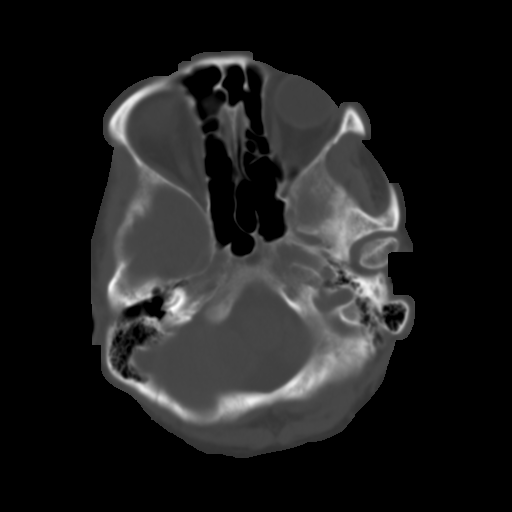
[im 8/30  brain]
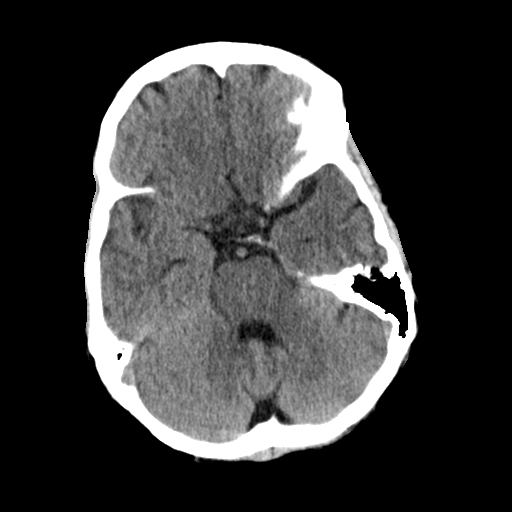
[im 11/30  brain]
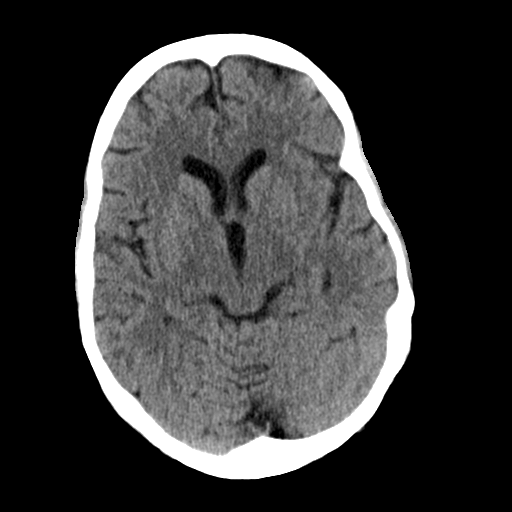
[im 15/30  brain]
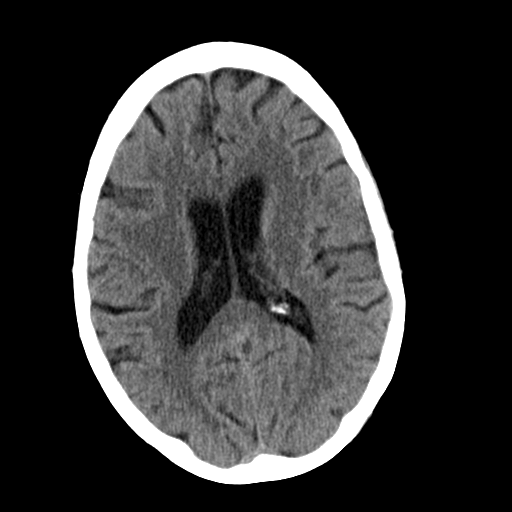
[im 19/30  brain]
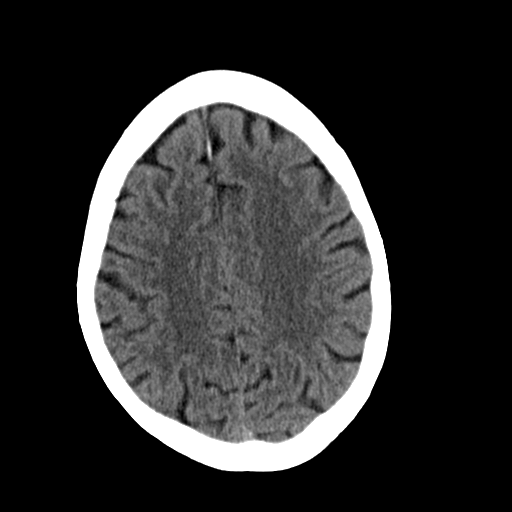
[im 19/30  bone]
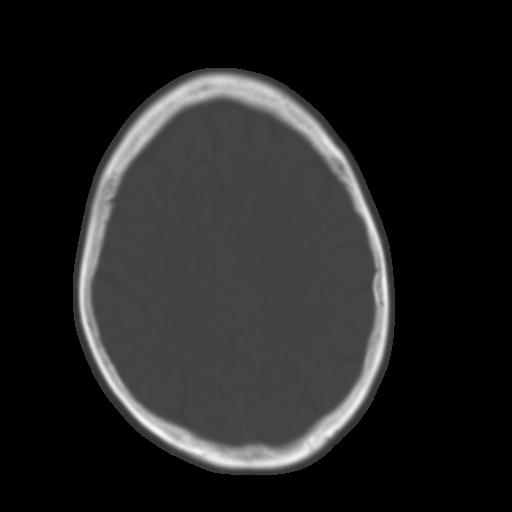
[im 22/30  brain]
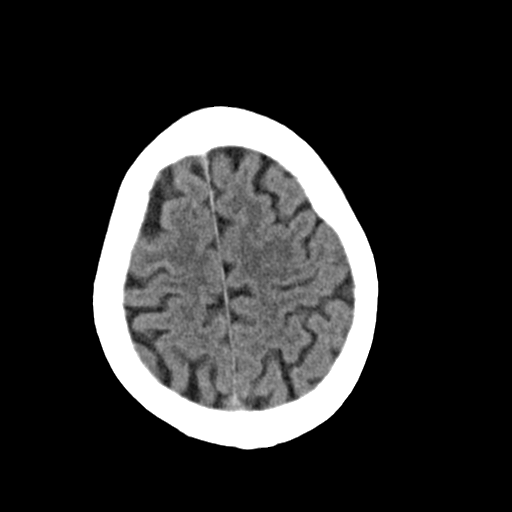
[im 26/30  brain]
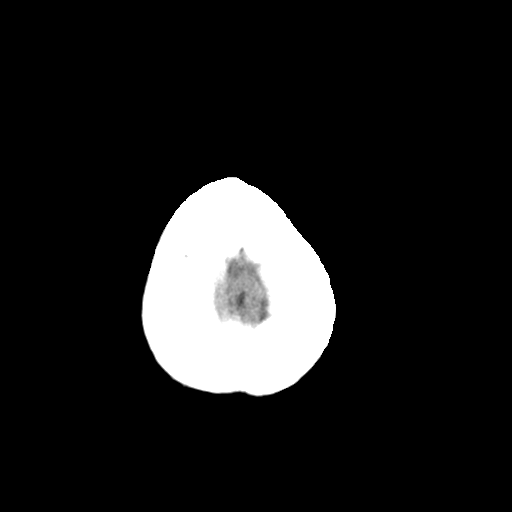

[Series 3: head bone · axial · 0.41mm/px · z∈[-302,-274]mm · 3 of 74 slices shown]
[im 8/74  bone]
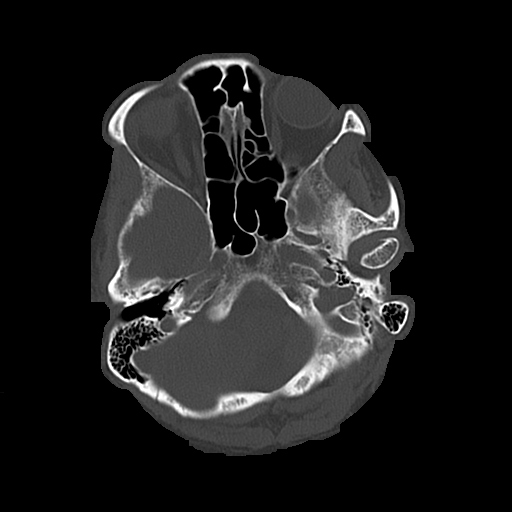
[im 15/74  bone]
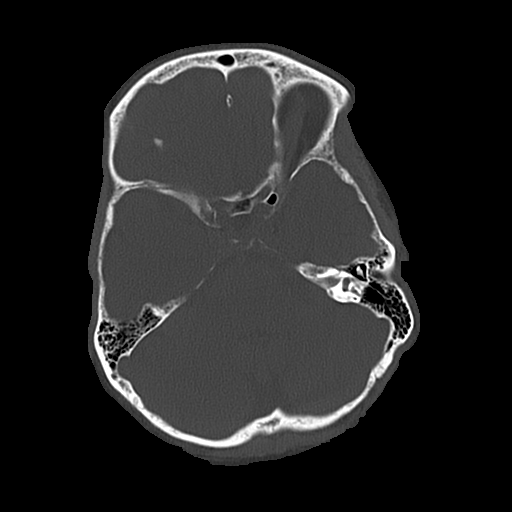
[im 22/74  bone]
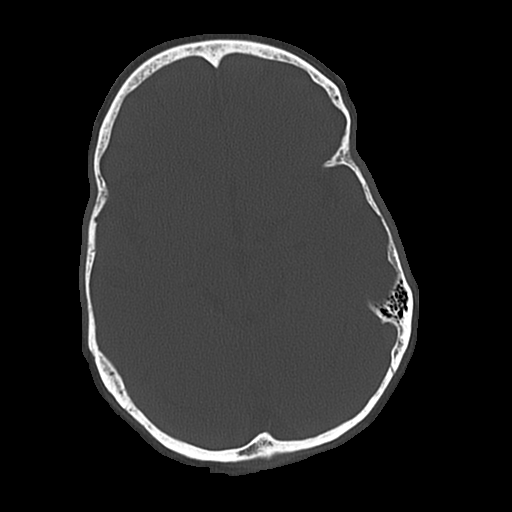

[Series 4: coronal soft · coronal · 0.29mm/px · 3 of 66 slices shown]
[im 22/66  brain]
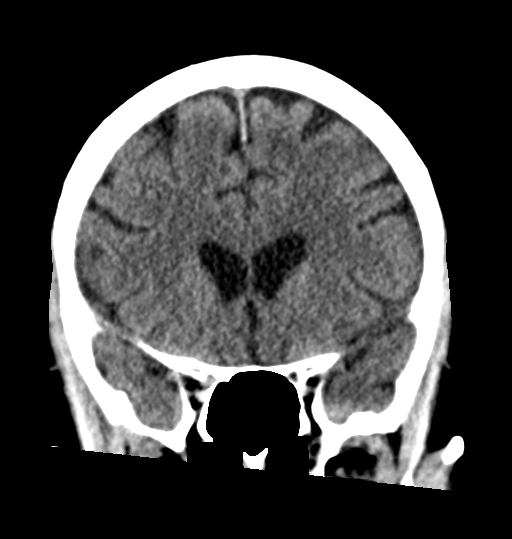
[im 29/66  brain]
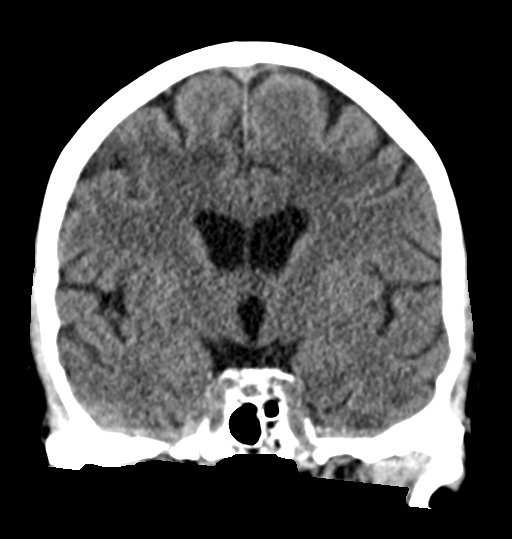
[im 37/66  brain]
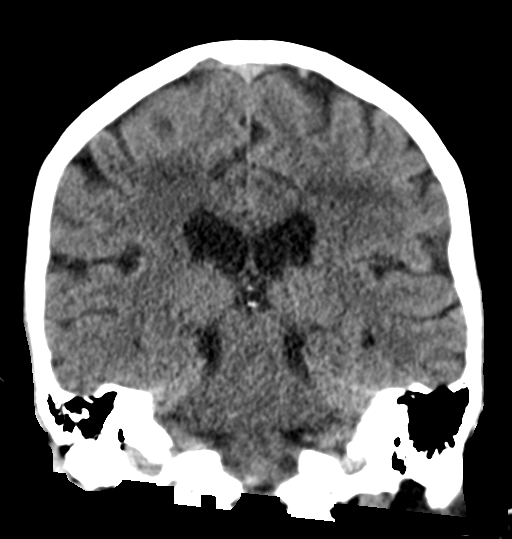

[Series 5: sagittal soft · sagittal · 0.31mm/px · 3 of 51 slices shown]
[im 17/51  brain]
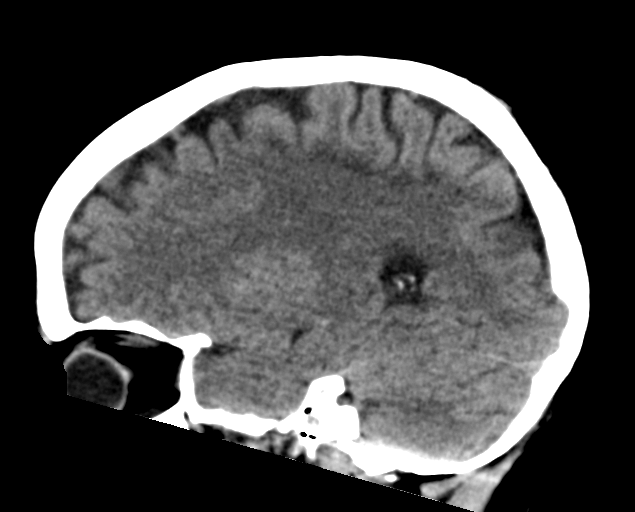
[im 26/51  brain]
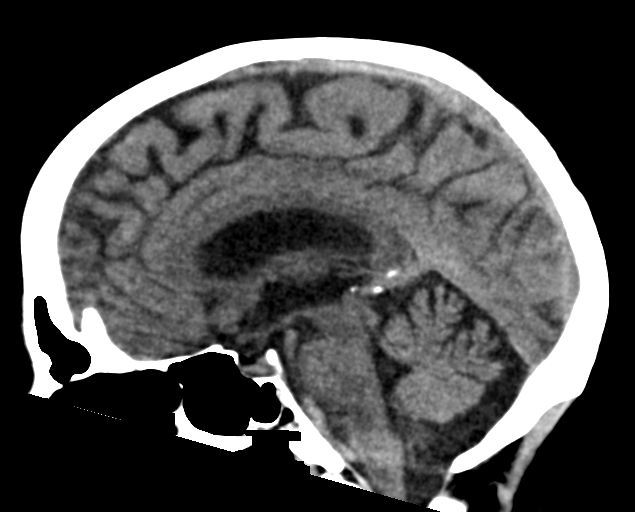
[im 34/51  brain]
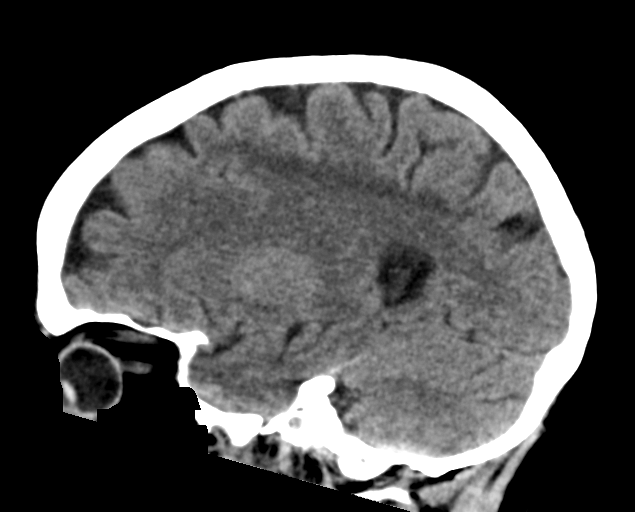

[16 of 47 positions shown; findings below may reference images not displayed]

FINDINGS: CT HEAD FINDINGS

Brain: There is no evidence for acute hemorrhage, hydrocephalus,
mass lesion, or abnormal extra-axial fluid collection. No definite
CT evidence for acute infarction.

Vascular: No hyperdense vessel or unexpected calcification.

Skull: No evidence for fracture. No worrisome lytic or sclerotic
lesion.

Sinuses/Orbits: The visualized paranasal sinuses and mastoid air
cells are clear. Visualized portions of the globes and intraorbital
fat are unremarkable.

Other: None.

CT CERVICAL SPINE FINDINGS

Alignment: Normal.

Skull base and vertebrae: No acute fracture. No primary bone lesion
or focal pathologic process.

Soft tissues and spinal canal: No prevertebral fluid or swelling. No
visible canal hematoma.

Disc levels: Loss of disc height with endplate degeneration noted
C4-5 into a more mild degree at C5-6 and C6-7.

Upper chest: Unremarkable.

Other: None.
IMPRESSION: 1. Stable head CT.  No acute intracranial abnormality.
2. Unremarkable CT cervical spine.  No evidence of fracture.

## 2023-10-06 ENCOUNTER — Encounter (HOSPITAL_COMMUNITY): Payer: Self-pay | Admitting: Emergency Medicine

## 2023-10-06 ENCOUNTER — Inpatient Hospital Stay (HOSPITAL_COMMUNITY)
Admission: EM | Admit: 2023-10-06 | Discharge: 2023-10-08 | DRG: 315 | Disposition: A | Discharge status: Home / Self Care | Payer: Medicare Other | Attending: Internal Medicine | Admitting: Internal Medicine

## 2023-10-06 ENCOUNTER — Emergency Department (HOSPITAL_COMMUNITY): Payer: Medicare Other

## 2023-10-06 ENCOUNTER — Other Ambulatory Visit: Payer: Self-pay

## 2023-10-06 DIAGNOSIS — I350 Nonrheumatic aortic (valve) stenosis: Secondary | ICD-10-CM

## 2023-10-06 DIAGNOSIS — R55 Syncope and collapse: Secondary | ICD-10-CM | POA: Diagnosis present

## 2023-10-06 DIAGNOSIS — Z91040 Latex allergy status: Secondary | ICD-10-CM

## 2023-10-06 DIAGNOSIS — S0181XA Laceration without foreign body of other part of head, initial encounter: Secondary | ICD-10-CM

## 2023-10-06 DIAGNOSIS — I471 Supraventricular tachycardia, unspecified: Secondary | ICD-10-CM | POA: Diagnosis present

## 2023-10-06 DIAGNOSIS — S01112A Laceration without foreign body of left eyelid and periocular area, initial encounter: Secondary | ICD-10-CM | POA: Diagnosis present

## 2023-10-06 DIAGNOSIS — G43909 Migraine, unspecified, not intractable, without status migrainosus: Secondary | ICD-10-CM | POA: Diagnosis present

## 2023-10-06 DIAGNOSIS — I4719 Other supraventricular tachycardia: Secondary | ICD-10-CM | POA: Diagnosis present

## 2023-10-06 DIAGNOSIS — Z79899 Other long term (current) drug therapy: Secondary | ICD-10-CM

## 2023-10-06 DIAGNOSIS — I959 Hypotension, unspecified: Secondary | ICD-10-CM | POA: Diagnosis not present

## 2023-10-06 DIAGNOSIS — S12100A Unspecified displaced fracture of second cervical vertebra, initial encounter for closed fracture: Principal | ICD-10-CM

## 2023-10-06 DIAGNOSIS — I083 Combined rheumatic disorders of mitral, aortic and tricuspid valves: Secondary | ICD-10-CM | POA: Diagnosis present

## 2023-10-06 DIAGNOSIS — E785 Hyperlipidemia, unspecified: Secondary | ICD-10-CM | POA: Diagnosis present

## 2023-10-06 DIAGNOSIS — Z8249 Family history of ischemic heart disease and other diseases of the circulatory system: Secondary | ICD-10-CM

## 2023-10-06 DIAGNOSIS — Z8349 Family history of other endocrine, nutritional and metabolic diseases: Secondary | ICD-10-CM

## 2023-10-06 DIAGNOSIS — D696 Thrombocytopenia, unspecified: Secondary | ICD-10-CM | POA: Diagnosis present

## 2023-10-06 DIAGNOSIS — Z87440 Personal history of urinary (tract) infections: Secondary | ICD-10-CM

## 2023-10-06 DIAGNOSIS — Z882 Allergy status to sulfonamides status: Secondary | ICD-10-CM

## 2023-10-06 DIAGNOSIS — Z881 Allergy status to other antibiotic agents status: Secondary | ICD-10-CM

## 2023-10-06 DIAGNOSIS — W19XXXA Unspecified fall, initial encounter: Secondary | ICD-10-CM | POA: Diagnosis present

## 2023-10-06 LAB — URINALYSIS, ROUTINE W REFLEX MICROSCOPIC
Bilirubin Urine: NEGATIVE
Glucose, UA: NEGATIVE mg/dL
Hgb urine dipstick: NEGATIVE
Ketones, ur: 20 mg/dL — AB
Nitrite: NEGATIVE
Protein, ur: NEGATIVE mg/dL
Specific Gravity, Urine: 1.01 (ref 1.005–1.030)
pH: 8 (ref 5.0–8.0)

## 2023-10-06 LAB — CBG MONITORING, ED: Glucose-Capillary: 82 mg/dL (ref 70–99)

## 2023-10-06 LAB — TROPONIN I (HIGH SENSITIVITY)
Troponin I (High Sensitivity): 4 ng/L (ref <18)
Troponin I (High Sensitivity): 3 ng/L (ref <18)

## 2023-10-06 LAB — CBC WITH DIFFERENTIAL/PLATELET
Abs Immature Granulocytes: 0.02 10*3/uL (ref 0.00–0.07)
Basophils Absolute: 0 10*3/uL (ref 0.0–0.1)
Basophils Relative: 1 %
Eosinophils Absolute: 0.1 10*3/uL (ref 0.0–0.5)
Eosinophils Relative: 1 %
HCT: 37.7 % (ref 36.0–46.0)
Hemoglobin: 13 g/dL (ref 12.0–15.0)
Immature Granulocytes: 0 %
Lymphocytes Relative: 12 %
Lymphs Abs: 0.7 10*3/uL (ref 0.7–4.0)
MCH: 32.3 pg (ref 26.0–34.0)
MCHC: 34.5 g/dL (ref 30.0–36.0)
MCV: 93.5 fL (ref 80.0–100.0)
Monocytes Absolute: 0.6 10*3/uL (ref 0.1–1.0)
Monocytes Relative: 10 %
Neutro Abs: 4.7 10*3/uL (ref 1.7–7.7)
Neutrophils Relative %: 76 %
Platelets: 145 10*3/uL — ABNORMAL LOW (ref 150–400)
RBC: 4.03 MIL/uL (ref 3.87–5.11)
RDW: 12.2 % (ref 11.5–15.5)
WBC: 6.2 10*3/uL (ref 4.0–10.5)
nRBC: 0 % (ref 0.0–0.2)

## 2023-10-06 LAB — BASIC METABOLIC PANEL
Anion gap: 8 (ref 5–15)
BUN: 8 mg/dL (ref 8–23)
CO2: 25 mmol/L (ref 22–32)
Calcium: 9.1 mg/dL (ref 8.9–10.3)
Chloride: 105 mmol/L (ref 98–111)
Creatinine, Ser: 0.96 mg/dL (ref 0.44–1.00)
GFR, Estimated: >60 mL/min (ref >60)
Glucose, Bld: 96 mg/dL (ref 70–99)
Potassium: 4 mmol/L (ref 3.5–5.1)
Sodium: 138 mmol/L (ref 135–145)

## 2023-10-06 MED ORDER — ACETAMINOPHEN 650 MG RE SUPP: 650.0000 mg | Freq: Four times a day (QID) | RECTAL | Status: DC | PRN

## 2023-10-06 MED ORDER — RIVAROXABAN 10 MG PO TABS: 10.0000 mg | ORAL_TABLET | Freq: Every day | ORAL | Status: DC

## 2023-10-06 MED ORDER — FAMOTIDINE-CA CARB-MAG HYDROX 10-800-165 MG PO CHEW: 1.0000 | CHEWABLE_TABLET | Freq: Every day | ORAL | Status: DC | PRN

## 2023-10-06 MED ORDER — ROSUVASTATIN CALCIUM 5 MG PO TABS
10.0000 mg | ORAL_TABLET | Freq: Every day | ORAL | Status: DC
  Administered 2023-10-06 – 2023-10-08 (×3): 10 mg via ORAL
  Filled 2023-10-06 (×3): qty 2

## 2023-10-06 MED ORDER — OYSTER SHELL CALCIUM/D3 500-5 MG-MCG PO TABS
1.0000 | ORAL_TABLET | Freq: Two times a day (BID) | ORAL | Status: DC
  Administered 2023-10-07 – 2023-10-08 (×3): 1 via ORAL
  Filled 2023-10-06 (×7): qty 1

## 2023-10-06 MED ORDER — CARBOXYMETHYLCELLUL-GLYCERIN 0.5-0.9 % OP SOLN: 1.0000 [drp] | OPHTHALMIC | Status: DC | PRN

## 2023-10-06 MED ORDER — RIVAROXABAN 10 MG PO TABS
10.0000 mg | ORAL_TABLET | Freq: Every day | ORAL | Status: DC
  Administered 2023-10-06 – 2023-10-08 (×3): 10 mg via ORAL
  Filled 2023-10-06 (×3): qty 1

## 2023-10-06 MED ORDER — DICLOFENAC SODIUM 1 % TD GEL: 2.0000 g | Freq: Every day | TRANSDERMAL | Status: DC | PRN

## 2023-10-06 MED ORDER — CARBOXYMETHYLCELLULOSE SODIUM 0.5 % OP SOLN: 1.0000 [drp] | Freq: Every day | OPHTHALMIC | Status: DC | PRN

## 2023-10-06 MED ORDER — FAMOTIDINE 20 MG PO TABS: 10.0000 mg | ORAL_TABLET | Freq: Every day | ORAL | Status: DC | PRN

## 2023-10-06 MED ORDER — POLYETHYLENE GLYCOL 3350 17 G PO PACK: 17.0000 g | PACK | Freq: Every day | ORAL | Status: DC | PRN

## 2023-10-06 MED ORDER — VITAMIN C 500 MG PO TABS
500.0000 mg | ORAL_TABLET | Freq: Two times a day (BID) | ORAL | Status: DC
  Administered 2023-10-06 – 2023-10-08 (×4): 500 mg via ORAL
  Filled 2023-10-06 (×4): qty 1

## 2023-10-06 MED ORDER — CALCIUM-VITAMIN D-VITAMIN K 650-12.5-40 MG-MCG-MCG PO CHEW: 1.0000 | CHEWABLE_TABLET | Freq: Two times a day (BID) | ORAL | Status: DC

## 2023-10-06 MED ORDER — OXYCODONE HCL 5 MG PO TABS: 5.0000 mg | ORAL_TABLET | ORAL | Status: DC | PRN

## 2023-10-06 MED ORDER — ACETAMINOPHEN 325 MG PO TABS
650.0000 mg | ORAL_TABLET | Freq: Four times a day (QID) | ORAL | Status: DC | PRN
  Administered 2023-10-07: 650 mg via ORAL
  Filled 2023-10-06: qty 2

## 2023-10-06 MED ORDER — DICLOFENAC SODIUM 1 % EX GEL: 2.0000 g | Freq: Four times a day (QID) | CUTANEOUS | Status: DC | PRN

## 2023-10-06 NOTE — ED Provider Notes (Signed)
[facility] AT [facility] Provider Note   CSN: [id number] Arrival date & time: 10/06/23  4034     History  Chief Complaint  Patient presents with   Loss of Consciousness    Kayla Ward is a 78 y.o. female.  The history is provided by the patient, the EMS personnel and medical records. No language interpreter was used.  Loss of Consciousness    78 year old female significant history of aortic stenosis, hyperlipidemia, dysrhythmia brought here via EMS for evaluation of a recent syncopal episode.  Patient report this morning she went to use the bathroom to urinate.  Afterwards she was watching her face, had a sip of water and was walking back to her bed when she found herself waking up from the floor for which she believes she had a syncopal episode and have "face planted" onto the floor.  She noticed a cut to her left side of her face, she stayed on the ground for period of time and husband called EMS to bring her here.  Prior to her syncopal episode she did notice some mild abdominal discomfort but that has since resolved.  She also report noticing some mild tightness to the right side of her chest.  She is currently not endorsing any severe headache, vision changes, neck pain, pain to extremities, focal numbness or focal weakness or confusion.  No tongue biting or urinating herself.  No history of seizure.  Denies any heart palpitation.  She is not on blood thinning medication.  She has had multiple syncopal episodes like this in the past which usually happen once every 2 years.  Home Medications Prior to Admission medications   Medication Sig Start Date End Date Taking? Authorizing Provider  acetaminophen (TYLENOL) 500 MG tablet Take 1,000 mg by mouth every 6 (six) hours as needed for pain.    [provider]  Ascorbic Acid (VITAMIN C) 1000 MG tablet Take 1,000 mg by mouth daily.    [provider]  calcium carbonate (OS-CAL) 1250  (500 Ca) MG chewable tablet Chew by mouth. 12/07/17   [provider]  Calcium-Vitamin D-Vitamin K (CALCIUM + D + K PO) Take 2 tablets by mouth daily.    [provider]  carboxymethylcellulose (REFRESH PLUS) 0.5 % SOLN Place 1 drop into both eyes daily.    [provider]  cetirizine (ZYRTEC) 10 MG tablet Take 10 mg by mouth daily as needed for allergies.    [provider]  diclofenac sodium (VOLTAREN) 1 % GEL Apply 2 g topically daily as needed (pain).     [provider]  famotidine (PEPCID) 20 MG tablet Take 20 mg by mouth as needed for heartburn or indigestion.    [provider]  fluticasone (FLONASE) 50 MCG/ACT nasal spray Place 2 sprays into the nose daily as needed for allergies.    [provider]  Multiple Vitamin (MULTIVITAMIN WITH MINERALS) TABS tablet Take 1 tablet by mouth daily. [employer], Historical, MD  rosuvastatin (CRESTOR) 10 MG tablet Take 1 tablet (10 mg total) by mouth daily. 11/26/22   Patwardhan, Anabel Bene, MD  SUMAtriptan (IMITREX) 50 MG tablet Take 50 mg by mouth every 2 (two) hours as needed. 04/14/22   [provider]  TAURINE PO Take 1 tablet by mouth daily at 2 PM.    [provider]      Allergies    Bactrim [sulfamethoxazole-trimethoprim], Sulfa antibiotics, Other, and Latex  Review of Systems   Review of Systems  Cardiovascular:  Positive for syncope.  All other systems reviewed and are negative.   Physical Exam Updated Vital Signs BP (!) 117/58 (BP Location: Right Arm)   Pulse 72   Temp 97.7 F (36.5 C) (Oral)   Resp 14   Ht 5\' 3"  (1.6 m)   Wt 54.4 kg   SpO2 100%   BMI 21.26 kg/m  Physical Exam Vitals and nursing note reviewed.  Constitutional:      General: She is not in acute distress.    Appearance: She is well-developed.  HENT:     Head: Normocephalic.     Comments: An oblique skin abrasion noted to left orbital region without ocular  involvement.  It is not actively bleeding and it is minimally tender to palpation.  The length is approximately 4 cm in diameter.  No Battle sign, no raccoon's eyes, no midface tenderness no malocclusion no septal hematoma    Nose: Nose normal.     Mouth/Throat:     Mouth: Mucous membranes are moist.  Eyes:     Conjunctiva/sclera: Conjunctivae normal.  Cardiovascular:     Rate and Rhythm: Normal rate and regular rhythm.     Pulses: Normal pulses.     Heart sounds: Normal heart sounds.  Pulmonary:     Effort: Pulmonary effort is normal.  Chest:     Chest wall: No tenderness.  Abdominal:     Palpations: Abdomen is soft.     Tenderness: There is no abdominal tenderness.  Musculoskeletal:        General: Normal range of motion.     Cervical back: Neck supple.     Comments: 5/5 strength to all 4 extremities  Skin:    Findings: No rash.  Neurological:     Mental Status: She is alert.     GCS: GCS eye subscore is 4. GCS verbal subscore is 5. GCS motor subscore is 6.     Sensory: Sensation is intact.     Motor: Motor function is intact.     Coordination: Coordination is intact.  Psychiatric:        Mood and Affect: Mood normal.     ED Results / Procedures / Treatments   Labs (all labs ordered are listed, but only abnormal results are displayed) Labs Reviewed  CBC WITH DIFFERENTIAL/PLATELET - Abnormal; Notable for the following components:      Result Value   Platelets 145 (*)    All other components within normal limits  BASIC METABOLIC PANEL  URINALYSIS, ROUTINE W REFLEX MICROSCOPIC  CBG MONITORING, ED  TROPONIN I (HIGH SENSITIVITY)  TROPONIN I (HIGH SENSITIVITY)    EKG EKG Interpretation Date/Time:  Thursday October 06 2023 09:12:35 EST Ventricular Rate:  68 PR Interval:  155 QRS Duration:  75 QT Interval:  412 QTC Calculation: 439 R Axis:   72  Text Interpretation: Sinus rhythm Confirmed by Estanislado Pandy [phone]) on 10/06/2023 10:46:06 AM  Radiology CT Head Wo  Contrast Result Date: 10/06/2023 CLINICAL DATA:  Blunt trauma. EXAM: CT HEAD WITHOUT CONTRAST CT CERVICAL SPINE WITHOUT CONTRAST TECHNIQUE: Multidetector CT imaging of the head and cervical spine was performed following the standard protocol without intravenous contrast. Multiplanar CT image reconstructions of the cervical spine were also generated. RADIATION DOSE REDUCTION: This exam was performed according to the departmental dose-optimization program which includes automated exposure control, adjustment of the mA and/or kV according to patient size and/or use of iterative reconstruction technique. COMPARISON:  None Available. FINDINGS: CT HEAD FINDINGS Brain: The ventricles and sulci are appropriate size for the patient's age. The gray-white matter discrimination is preserved. There is no acute intracranial hemorrhage. No mass effect or midline shift. No extra-axial fluid collection. Vascular: No hyperdense vessel or unexpected calcification. Skull: Normal. Negative for fracture or focal lesion. Sinuses/Orbits: There is chronic right maxillary sinus disease with partial opacification. No air-fluid level. The mastoid air cells are clear. Other: None CT CERVICAL SPINE FINDINGS Alignment: No acute subluxation. Skull base and vertebrae: Minimally displaced avulsion fracture of the anterior inferior corner of C2 vertebra consistent with an extension teardrop fracture. No other acute fracture. The bones are osteopenic. Soft tissues and spinal canal: No prevertebral fluid or swelling. No visible canal hematoma. Disc levels:  No acute findings.  Degenerative changes. Upper chest: No acute finding. Other: None IMPRESSION: 1. No acute intracranial pathology. 2. Minimally displaced avulsion fracture of the anterior base of the C2. These results were called by telephone at the time of interpretation on 10/06/2023 at 10:49 am to provider [facility] , who verbally acknowledged these results. Electronically Signed   By: Elgie Collard M.D.   On: 10/06/2023 10:53   CT Cervical Spine Wo Contrast Result Date: 10/06/2023 CLINICAL DATA:  Blunt trauma. EXAM: CT HEAD WITHOUT CONTRAST CT CERVICAL SPINE WITHOUT CONTRAST TECHNIQUE: Multidetector CT imaging of the head and cervical spine was performed following the standard protocol without intravenous contrast. Multiplanar CT image reconstructions of the cervical spine were also generated. RADIATION DOSE REDUCTION: This exam was performed according to the departmental dose-optimization program which includes automated exposure control, adjustment of the mA and/or kV according to patient size and/or use of iterative reconstruction technique. COMPARISON:  None Available. FINDINGS: CT HEAD FINDINGS Brain: The ventricles and sulci are appropriate size for the patient's age. The gray-white matter discrimination is preserved. There is no acute intracranial hemorrhage. No mass effect or midline shift. No extra-axial fluid collection. Vascular: No hyperdense vessel or unexpected calcification. Skull: Normal. Negative for fracture or focal lesion. Sinuses/Orbits: There is chronic right maxillary sinus disease with partial opacification. No air-fluid level. The mastoid air cells are clear. Other: None CT CERVICAL SPINE FINDINGS Alignment: No acute subluxation. Skull base and vertebrae: Minimally displaced avulsion fracture of the anterior inferior corner of C2 vertebra consistent with an extension teardrop fracture. No other acute fracture. The bones are osteopenic. Soft tissues and spinal canal: No prevertebral fluid or swelling. No visible canal hematoma. Disc levels:  No acute findings.  Degenerative changes. Upper chest: No acute finding. Other: None IMPRESSION: 1. No acute intracranial pathology. 2. Minimally displaced avulsion fracture of the anterior base of the C2. These results were called by telephone at the time of interpretation on 10/06/2023 at 10:49 am to provider [facility] , who verbally  acknowledged these results. Electronically Signed   By: Elgie Collard M.D.   On: 10/06/2023 10:53    Procedures Procedures    Medications Ordered in ED Medications - No data to display  ED Course/ Medical Decision Making/ A&P                                 Medical Decision Making  BP (!) 117/58 (BP Location: Right Arm)   Pulse 72   Temp 97.7 F (36.5 C) (Oral)   Resp 14   Ht 5\' 3"  (1.6 m)   Wt 54.4 kg   SpO2 100%  BMI 21.26 kg/m   73:2 AM  78 year old female significant history of aortic stenosis, hyperlipidemia, dysrhythmia brought here via EMS for evaluation of a recent syncopal episode.  Patient report this morning she went to use the bathroom to urinate.  Afterwards she was watching her face, had a sip of water and was walking back to her bed when she found herself waking up from the floor for which she believes she had a syncopal episode and have "face planted" onto the floor.  She noticed a cut to her left side of her face, she stayed on the ground for period of time and husband called EMS to bring her here.  Prior to her syncopal episode she did notice some mild abdominal discomfort but that has since resolved.  She also report noticing some mild tightness to the right side of her chest.  She is currently not endorsing any severe headache, vision changes, neck pain, pain to extremities, focal numbness or focal weakness or confusion.  No tongue biting or urinating herself.  No history of seizure.  Denies any heart palpitation.  She is not on blood thinning medication.  She has had multiple syncopal episodes like this in the past which usually happen once every 2 years.  Exam notable for small scratch noted to the left side of face near the left orbital region without any ocular involvement.  No other significant signs of injury noted on exam.  Patient is alert and oriented x 4 and answering questions appropriately.  No significant midline spine tenderness.  Workup  initiated.  Pain medication offered but patient declined.  Patient is up-to-date with tetanus.  -Labs ordered, independently viewed and interpreted by me.  Labs remarkable for reassuring lab values.  UA pending -The patient was maintained on a cardiac monitor.  I personally viewed and interpreted the cardiac monitored which showed an underlying rhythm of: NSR -Imaging independently viewed and interpreted by me and I agree with radiologist's interpretation.  Result remarkable for CT head/cspine showing no acute intracranial abnormality however pt does have an anterior avulsion fx of the C2.  She is neurovascularly intact.  C-collar placed.  Will consult neurosurgery -This patient presents to the ED for concern of syncope, this involves an extensive number of treatment options, and is a complaint that carries with it a high risk of complications and morbidity.  The differential diagnosis includes aortic stenosis, cardiach arrhythmia, anemia, electrolytes derangement, vasovagal -Co morbidities that complicate the patient evaluation includes aortic stenosis, cardiac dysrythmia -Treatment includes monitoring and C-collar -Reevaluation of the patient after these medicines showed that the patient improved -PCP office notes or outside notes reviewed -Discussion with specialist Internal Medicine team who agrees to admit pt.  I have also consulted neurosurgery Dr. Esperanza Richters who agrees with [facility] J collar and for pt to f/u in office in 6 weeks for repeat imaging.  -Escalation to admission/observation considered: patient is agreeable with admission.          Final Clinical Impression(s) / ED Diagnoses Final diagnoses:  Traumatic closed fracture of C2 vertebra with minimal displacement, initial encounter [facility])  Facial laceration, initial encounter  Syncope and collapse    Rx / DC Orders ED Discharge Orders     None         Fayrene Helper, PA-C 10/06/23 1158    Coral Spikes, DO 10/06/23  1533

## 2023-10-06 NOTE — ED Triage Notes (Signed)
Patient presents via EMS from home for syncope. Per EMS, patient woke up, used the bathroom, went to the sink, washed, had a sip of water, and turned to go back to bed and then woke up on the floor. Patient believes she "face-planted" onto the floor. Patient denies any blood thinners. Patient states that her husband heard the fall but didn't witness it. Per patient, her husband stated that she had started to wake up when he got to her. Patient is AAOx4; speaking in clear, full sentences upon arrival. Patient had a reclast infusion on Tuesday for osteoporosis.  112/90 68 CBG 105 100% RA 18g LAC

## 2023-10-06 NOTE — ED Notes (Addendum)
RN called CCMD to confirm cardiac monitoring.

## 2023-10-06 NOTE — Hospital Course (Addendum)
 Marland Kitchen

## 2023-10-06 NOTE — Consult Note (Signed)
Cardiology Consultation   Patient ID: Kayla Ward MRN: [id number]; DOB: 06/04/1946  Admit date: 10/06/2023 Date of Consult: 10/06/2023  PCP:  Gwenlyn Found, MD   [facility] Providers Cardiologist:  None   {   Patient Profile:   Kayla Ward is a 78 y.o. female with a hx of aortic stenosis, aortic regurgitation, hyperlipidemia, history of syncopal episodes who is being seen 10/06/2023 for the evaluation of syncope at the request of Dr. Ninetta Lights.  History of Present Illness:   Ms. Forni is a 78yo female with a past medical history of aortic stenosis, aortic regurgitation, hyperlipidemia, history of syncopal episodes. She presented to [facility] ED morning of 10/06/2023 for the evaluation of a recent syncopal episode.   Per chart review, patient was in the bathroom this morning and then woke up on the floor. Patient states she may have face planted onto the floor, she noticed a cut on the left side of her face. Her husband then called EMS who brought her to the ED. She notes some mild abdominal discomfort and mild tightness to the right side of her chest prior to her syncopal episode, which has since resolved. She denied any headaches, vision changes, neck pain, extremity pain, focal numbness/weakness or confusion. She reports no tongue biting or urinating herself. She has no known history of seizures. Denies any heart palpitations. She is not currently on any blood thinners. She has had a history of syncopal episodes, usually occurring once every 2 years.   In the ED she had an EKG which showed sinus rhythm with HR 68, unremarkable BMP, CBC showed mild thrombocytopenia, negative HS troponin x 2. CT head/cervical spine showed no acute intracranial pathology, minimally displaced avulsion fracture of anterior base of C2.   She has been admitted to medicine, awaiting an updated echocardiogram and orthostatics.  After speaking with the patient she agrees with history as  stated above. She seems so be more unclear of a true prodrome because she states she felt "groggy" and tired but it was also after she first woke up. Then she stated she might have been confused when she came to but says that when the fire department showed up she remembers everything and was able to answer all of their questions.   She mentions a history of UTI that previously lead her to have a syncopal episode. She reports having an alleged UTI in Christmas, never formally diagnosed, that she self-treated with cranberry juice. Will obtain a UA to ensure there is not an underlying infection.  Past Medical History:  Diagnosis Date   Aortic regurgitation    AS (aortic stenosis)    Dysrhythmia    Headache    Hyperlipidemia    Seasonal allergies    Past Surgical History:  Procedure Laterality Date   ESOPHAGOGASTRODUODENOSCOPY N/A 07/17/2021   Procedure: ESOPHAGOGASTRODUODENOSCOPY (EGD);  Surgeon: Jeani Hawking, MD;  Location: [facility]COPY;  Service: Endoscopy;  Laterality: N/A;   RIGHT/LEFT HEART CATH AND CORONARY ANGIOGRAPHY N/A 11/10/2021   Procedure: RIGHT/LEFT HEART CATH AND CORONARY ANGIOGRAPHY;  Surgeon: Elder Negus, MD;  Location: [facility] INVASIVE CV LAB;  Service: Cardiovascular;  Laterality: N/A;   TEE WITHOUT CARDIOVERSION N/A 11/10/2021   Procedure: TRANSESOPHAGEAL ECHOCARDIOGRAM (TEE);  Surgeon: Yates Decamp, MD;  Location: [facility] ENDOSCOPY;  Service: Cardiovascular;  Laterality: N/A;   UPPER ESOPHAGEAL ENDOSCOPIC ULTRASOUND (EUS) N/A 07/17/2021   Procedure: UPPER ESOPHAGEAL ENDOSCOPIC ULTRASOUND (EUS);  Surgeon: Jeani Hawking, MD;  Location: [facility] ENDOSCOPY;  Service:  Endoscopy;  Laterality: N/A;    Home Medications:  Prior to Admission medications   Medication Sig Start Date End Date Taking? Authorizing Provider  acetaminophen (TYLENOL) 500 MG tablet Take 1,000 mg by mouth daily as needed for pain or headache.   Yes [provider]  Artificial Saliva (ACT DRY MOUTH) LOZG Use  as directed 1 lozenge in the mouth or throat 2 (two) times daily as needed (dry mouth).   Yes [provider]  ascorbic acid (VITAMIN C) 500 MG tablet Take 500 mg by mouth 2 (two) times daily with a meal.   Yes [provider]  Calcium-Vitamin D-Vitamin K (VIACTIV CALCIUM PLUS D PO) Take 1 each by mouth 2 (two) times daily with a meal.   Yes [provider]  carboxymethylcellulose (REFRESH PLUS) 0.5 % SOLN Place 1 drop into both eyes daily as needed (dry eyes).   Yes [provider]  cetirizine (ZYRTEC) 10 MG tablet Take 10 mg by mouth daily as needed for allergies.   Yes [provider]  diclofenac sodium (VOLTAREN) 1 % GEL Apply 2 g topically daily as needed (right knee pain).   Yes [provider]  famotidine-calcium carbonate-magnesium hydroxide (PEPCID COMPLETE) 10-800-165 MG chewable tablet Chew 1 tablet by mouth daily as needed (heartburn, indigestion).   Yes [provider]  rosuvastatin (CRESTOR) 10 MG tablet Take 1 tablet (10 mg total) by mouth daily. 11/26/22  Yes Patwardhan, Manish J, MD  SUMAtriptan (IMITREX) 50 MG tablet Take 50 mg by mouth daily as needed for migraine. 04/14/22  Yes [provider]   Inpatient Medications: Scheduled Meds:  ascorbic acid  500 mg Oral BID WC   calcium-vitamin D  1 tablet Oral BID   rivaroxaban  10 mg Oral Daily   rosuvastatin  10 mg Oral Daily   Continuous Infusions:  PRN Meds: acetaminophen **OR** acetaminophen, carboxymethylcellul-glycerin, diclofenac Sodium, famotidine, oxyCODONE, polyethylene glycol  Allergies:    Allergies  Allergen Reactions   Bactrim [Sulfamethoxazole-Trimethoprim] Hives, Nausea And Vomiting and Other (See Comments)    Syncope    Sulfa Antibiotics Hives   Latex Rash   Social History:   Social History   Socioeconomic History   Marital status: Married    Spouse name: Not on file   Number of children: 2   Years of education: Not on file    Highest education level: Not on file  Occupational History   Not on file  Tobacco Use   Smoking status: Never   Smokeless tobacco: Never  Vaping Use   Vaping status: Never Used  Substance and Sexual Activity   Alcohol use: Yes    Comment: wine occ   Drug use: No   Sexual activity: Not on file  Other Topics Concern   Not on file  Social History Narrative   Not on file   Social Drivers of Health   Financial Resource Strain: Not on file  Food Insecurity: Not on file  Transportation Needs: Not on file  Physical Activity: Not on file  Stress: Not on file  Social Connections: Not on file  Intimate Partner Violence: Not on file    Family History:   Family History  Problem Relation Age of Onset   Cancer Mother    Thyroid disease Mother    Cancer Father    Heart disease Father    Heart murmur Sister    Kidney disease Son     ROS:  Please see the history of present illness.  All other ROS reviewed and negative.     Physical Exam/Data:   Vitals:   10/06/23 1550 10/06/23 1600 10/06/23 1700 10/06/23 1722  BP: 105/71 (!) 104/57 (!) 119/57 113/60  Pulse: 74 73 66 70  Resp: (!) 21 (!) 21 18 15   Temp:    98.8 F (37.1 C)  TempSrc:    Oral  SpO2: 99% 98% 98% 99%  Weight:      Height:       No intake or output data in the 24 hours ending 10/06/23 1747    10/06/2023    9:01 AM 11/26/2022    1:22 PM 11/26/2021   11:24 AM  Last 3 Weights  Weight (lbs) 120 lb 126 lb 12.8 oz 126 lb  Weight (kg) 54.432 kg 57.516 kg 57.153 kg     Body mass index is 21.26 kg/m.  General:  Well nourished, well developed, in no acute distress HEENT: normal Neck: no JVD Vascular: Distal pulses 2+ bilaterally Cardiac:  normal S1, S2; RRR; systolic murmur Lungs:  clear to auscultation anteriorly, no wheezing Abd: soft, nontender Ext: no edema Musculoskeletal:  No deformities Skin: warm and dry  Neuro:  no focal abnormalities noted Psych:  Normal affect   EKG:  The EKG was personally  reviewed and demonstrates:  sinus rhythm Telemetry:  Telemetry was personally reviewed and demonstrates:  sinus rhythm, HR 72  Relevant CV Studies: Echocardiogram (10/2022) Left ventricle cavity is normal in size and wall thickness. Normal global  wall motion. Normal LV systolic function with EF 70%. Doppler evidence of  grade I (impaired) diastolic dysfunction, normal LAP.  Trileaflet aortic valve with moderate aortic valve leaflet calcification.  Moderate aortic stenosis. Vmax 3.2 m/sec, mean PG 23 mmHg, AVA 0.8 cm by  continuity equation. Dimensionless index 0.28. Moderate (Grade II) aortic  regurgitation. Mild to moderate tricuspid regurgitation.  No evidence of pulmonary hypertension.  No significant change compared to previous study on 10/13/2021.   Laboratory Data:  High Sensitivity Troponin:   Recent Labs  Lab 10/06/23 0941 10/06/23 1154  TROPONINIHS 4 3     Chemistry Recent Labs  Lab 10/06/23 0941  NA 138  K 4.0  CL 105  CO2 25  GLUCOSE 96  BUN 8  CREATININE 0.96  CALCIUM 9.1  GFRNONAA >60  ANIONGAP 8    No results for input(s): "PROT", "ALBUMIN", "AST", "ALT", "ALKPHOS", "BILITOT" in the last 168 hours. Lipids No results for input(s): "CHOL", "TRIG", "HDL", "LABVLDL", "LDLCALC", "CHOLHDL" in the last 168 hours.  Hematology Recent Labs  Lab 10/06/23 0941  WBC 6.2  RBC 4.03  HGB 13.0  HCT 37.7  MCV 93.5  MCH 32.3  MCHC 34.5  RDW 12.2  PLT 145*   Thyroid No results for input(s): "TSH", "FREET4" in the last 168 hours.  BNPNo results for input(s): "BNP", "PROBNP" in the last 168 hours.  DDimer No results for input(s): "DDIMER" in the last 168 hours.  Radiology/Studies:  CT Head Wo Contrast Result Date: 10/06/2023 CLINICAL DATA:  Blunt trauma. EXAM: CT HEAD WITHOUT CONTRAST CT CERVICAL SPINE WITHOUT CONTRAST TECHNIQUE: Multidetector CT imaging of the head and cervical spine was performed following the standard protocol without intravenous contrast.  Multiplanar CT image reconstructions of the cervical spine were also generated. RADIATION DOSE REDUCTION: This exam was performed according to the departmental dose-optimization program which includes automated exposure control, adjustment of the mA and/or kV according to patient size and/or use of iterative reconstruction technique. COMPARISON:  None Available. FINDINGS:  CT HEAD FINDINGS Brain: The ventricles and sulci are appropriate size for the patient's age. The gray-white matter discrimination is preserved. There is no acute intracranial hemorrhage. No mass effect or midline shift. No extra-axial fluid collection. Vascular: No hyperdense vessel or unexpected calcification. Skull: Normal. Negative for fracture or focal lesion. Sinuses/Orbits: There is chronic right maxillary sinus disease with partial opacification. No air-fluid level. The mastoid air cells are clear. Other: None CT CERVICAL SPINE FINDINGS Alignment: No acute subluxation. Skull base and vertebrae: Minimally displaced avulsion fracture of the anterior inferior corner of C2 vertebra consistent with an extension teardrop fracture. No other acute fracture. The bones are osteopenic. Soft tissues and spinal canal: No prevertebral fluid or swelling. No visible canal hematoma. Disc levels:  No acute findings.  Degenerative changes. Upper chest: No acute finding. Other: None IMPRESSION: 1. No acute intracranial pathology. 2. Minimally displaced avulsion fracture of the anterior base of the C2. These results were called by telephone at the time of interpretation on 10/06/2023 at 10:49 am to provider [facility] , who verbally acknowledged these results. Electronically Signed   By: Elgie Collard M.D.   On: 10/06/2023 10:53   CT Cervical Spine Wo Contrast Result Date: 10/06/2023 CLINICAL DATA:  Blunt trauma. EXAM: CT HEAD WITHOUT CONTRAST CT CERVICAL SPINE WITHOUT CONTRAST TECHNIQUE: Multidetector CT imaging of the head and cervical spine was performed  following the standard protocol without intravenous contrast. Multiplanar CT image reconstructions of the cervical spine were also generated. RADIATION DOSE REDUCTION: This exam was performed according to the departmental dose-optimization program which includes automated exposure control, adjustment of the mA and/or kV according to patient size and/or use of iterative reconstruction technique. COMPARISON:  None Available. FINDINGS: CT HEAD FINDINGS Brain: The ventricles and sulci are appropriate size for the patient's age. The gray-white matter discrimination is preserved. There is no acute intracranial hemorrhage. No mass effect or midline shift. No extra-axial fluid collection. Vascular: No hyperdense vessel or unexpected calcification. Skull: Normal. Negative for fracture or focal lesion. Sinuses/Orbits: There is chronic right maxillary sinus disease with partial opacification. No air-fluid level. The mastoid air cells are clear. Other: None CT CERVICAL SPINE FINDINGS Alignment: No acute subluxation. Skull base and vertebrae: Minimally displaced avulsion fracture of the anterior inferior corner of C2 vertebra consistent with an extension teardrop fracture. No other acute fracture. The bones are osteopenic. Soft tissues and spinal canal: No prevertebral fluid or swelling. No visible canal hematoma. Disc levels:  No acute findings.  Degenerative changes. Upper chest: No acute finding. Other: None IMPRESSION: 1. No acute intracranial pathology. 2. Minimally displaced avulsion fracture of the anterior base of the C2. These results were called by telephone at the time of interpretation on 10/06/2023 at 10:49 am to provider [facility] , who verbally acknowledged these results. Electronically Signed   By: Elgie Collard M.D.   On: 10/06/2023 10:53   Assessment and Plan:   Aortic stenosis Patient has known AS, last echocardiogram from 10/2022 classified it as moderate She denies any exertional chest pain,  shortness of breath  She does not state any progression of symptoms acutely in the last 6 months - 1 year Last echo completed in 10/2022 showed: LVEF 70%, grade I diastolic dysfunction, moderate AS, mild to moderate TR -- Awaiting updated echocardiogram  Syncope History of syncopal episodes, this would be episode 3 since 2020  She notes that all of her prior episodes have been in the morning  Patient reports known orthostatics in the  past where she feels dizzy/lightheaded when she stands  CT head without acute intracranial abnormalities  -- Repeating echocardiogram  -- Orthostatic vitals pending  -- Continue to monitor on telemetry  -- Consider Loop recorder to monitor for repeated episodes of syncope  Per primary C2 fracture History of UTI Hyperlipidemia   For questions or updates, please contact [facility] Please consult [email or web address] for contact info under    Signed, Olena Leatherwood, PA-C  10/06/2023 5:47 PM

## 2023-10-06 NOTE — H&P (Addendum)
Date: 10/06/2023               Patient Name:  Kayla Ward MRN: [id number]  DOB: [birth year] Age / Sex: 78 y.o., female   PCP: Gwenlyn Found, MD         Medical Service: Internal Medicine Teaching Service         Attending Physician: Dr. Ginnie Smart, MD    First Contact: Dr. Carmina Miller Pager: [phone]  Second Contact: Dr. Gwenevere Abbot Pager: [phone]       After Hours (After 5p/  First Contact Pager: [phone]  weekends / holidays): Second Contact Pager: [phone]   Chief Complaint: syncope  History of Present Illness: Kayla Ward is a 78 y.o. F with PMH aortic stenosis with regurgitation, multiple syncopal episodes, HLD, who presents after a syncopal episode at home.  She explains that she had been up and moving for the morning, including using the restroom and then at the sink washing her hands when she felt some abdominal discomfort and woke up on the ground. She does not recall dizziness or headaches preceding this event but did have a cramping sensation in her chest that actually began last night and was present this morning on the L side of her chest but without radiation. She denies experiencing palpitations.   She explains that her prior syncopal episodes were also preceded by stomach discomfort. She has had vision changes with episodes in the past but no real changes that she can recall with this episode.   She denies focal weakness, pain. She was able to stand for her xray on arrival. She denies incontinence or tongue biting during the syncopal episode.   She does not think the frequency of syncopal episodes has increased. She has been eating and drinking well and denies recent illnesses. She does have to move slowly when changing positions to avoid postural dizziness. She also notes occasional migraines where she has symptoms of dizziness, decreased vision and floaters, head pain, which usually go away with rest and placing an ice pack on her neck. She had  a migraine earlier this week but does not think these symptoms are related.   Interestingly she did have her first reclast infusion Tuesday afternoon. She has felt fine until this morning since the infusion.  In regards to her history of aortic stenosis and regurgitation, she is due to see her cardiologist in February. She denies dyspnea at this time but describes occasionally needing to take a deep gasp of air because she feels like she needs oxygen. She did have this sensation after the syncopal episode today but at this time is asymptomatic.  Past Medical History: Past Medical History:  Diagnosis Date   Aortic regurgitation    AS (aortic stenosis)    Dysrhythmia    Headache    Hyperlipidemia    Seasonal allergies    Past Surgical History: Past Surgical History:  Procedure Laterality Date   ESOPHAGOGASTRODUODENOSCOPY N/A 07/17/2021   Procedure: ESOPHAGOGASTRODUODENOSCOPY (EGD);  Surgeon: Jeani Hawking, MD;  Location: [facility] ENDOSCOPY;  Service: Endoscopy;  Laterality: N/A;   RIGHT/LEFT HEART CATH AND CORONARY ANGIOGRAPHY N/A 11/10/2021   Procedure: RIGHT/LEFT HEART CATH AND CORONARY ANGIOGRAPHY;  Surgeon: Elder Negus, MD;  Location: [facility] INVASIVE CV LAB;  Service: Cardiovascular;  Laterality: N/A;   TEE WITHOUT CARDIOVERSION N/A 11/10/2021   Procedure: TRANSESOPHAGEAL ECHOCARDIOGRAM (TEE);  Surgeon: Yates Decamp, MD;  Location: [facility] ENDOSCOPY;  Service: Cardiovascular;  Laterality: N/A;  UPPER ESOPHAGEAL ENDOSCOPIC ULTRASOUND (EUS) N/A 07/17/2021   Procedure: UPPER ESOPHAGEAL ENDOSCOPIC ULTRASOUND (EUS);  Surgeon: Jeani Hawking, MD;  Location: [facility] ENDOSCOPY;  Service: Endoscopy;  Laterality: N/A;   Meds:  Current Meds  Medication Sig   acetaminophen (TYLENOL) 500 MG tablet Take 1,000 mg by mouth daily as needed for pain or headache.   Artificial Saliva (ACT DRY MOUTH) LOZG Use as directed 1 lozenge in the mouth or throat 2 (two) times daily as needed (dry mouth).   ascorbic acid  (VITAMIN C) 500 MG tablet Take 500 mg by mouth 2 (two) times daily with a meal.   Calcium-Vitamin D-Vitamin K (VIACTIV CALCIUM PLUS D PO) Take 1 each by mouth 2 (two) times daily with a meal.   carboxymethylcellulose (REFRESH PLUS) 0.5 % SOLN Place 1 drop into both eyes daily as needed (dry eyes).   cetirizine (ZYRTEC) 10 MG tablet Take 10 mg by mouth daily as needed for allergies.   diclofenac sodium (VOLTAREN) 1 % GEL Apply 2 g topically daily as needed (right knee pain).   famotidine-calcium carbonate-magnesium hydroxide (PEPCID COMPLETE) 10-800-165 MG chewable tablet Chew 1 tablet by mouth daily as needed (heartburn, indigestion).   rosuvastatin (CRESTOR) 10 MG tablet Take 1 tablet (10 mg total) by mouth daily.   SUMAtriptan (IMITREX) 50 MG tablet Take 50 mg by mouth daily as needed for migraine.   Allergies: Allergies as of 10/06/2023 - Review Complete 10/06/2023  Allergen Reaction Noted   Bactrim [sulfamethoxazole-trimethoprim] Hives, Nausea And Vomiting, and Other (See Comments) 07/22/2013   Sulfa antibiotics Hives 07/22/2013   Latex Rash 07/22/2013   Family History:  In addition to history below, Syncere explains that her son's kidney disease is polycystic kidney disease. She also details that her 45 year old grandson was recently diagnosed with a heart condition that was diagnosed after he had syncopal events and required defibrillation. She used the term "blue baby" as well. She does not recall the name of the condition he was diagnosed with. She says he has to be careful now.  Family History  Problem Relation Age of Onset   Cancer Mother    Thyroid disease Mother    Cancer Father    Heart disease Father    Heart murmur Sister    Kidney disease Son    Social History: Lives with her husband. Independent in ADLs, IADLs. No alcohol, tobacco, or other drug use. PCP is through [facility].  Review of Systems: A complete ROS was negative except as per HPI.   Physical Exam: Blood  pressure 114/66, pulse 66, temperature 97.7 F (36.5 C), temperature source Oral, resp. rate 18, height 5\' 3"  (1.6 m), weight 54.4 kg, SpO2 100%. Physical Exam Constitutional:      General: She is not in acute distress.    Appearance: She is not ill-appearing.  HENT:     Head:     Comments: Abrasion over L eyebrow/eyelid, clean and dry on exam. Eyes:     Extraocular Movements: Extraocular movements intact.     Pupils: Pupils are equal, round, and reactive to light.  Neck:     Comments: Cervical collar in place. Cardiovascular:     Rate and Rhythm: Normal rate and regular rhythm.     Heart sounds: Murmur heard.  Pulmonary:     Effort: Pulmonary effort is normal.     Breath sounds: Normal breath sounds.  Abdominal:     General: There is no distension.     Palpations: Abdomen is soft.  Tenderness: There is no abdominal tenderness. There is no guarding.  Musculoskeletal:        General: Normal range of motion.     Right lower leg: No edema.     Left lower leg: No edema.  Skin:    General: Skin is warm and dry.  Neurological:     General: No focal deficit present.     Mental Status: She is alert and oriented to person, place, and time. Mental status is at baseline.  Psychiatric:        Mood and Affect: Mood normal.        Thought Content: Thought content normal.    EKG: NSR.  CT head and C spine: 1. No acute intracranial pathology. 2. Minimally displaced avulsion fracture of the anterior base of the C2.  Assessment & Plan by Problem: Principal Problem:   Syncope  Syncope Without clear prodrome. She does have history of aortic stenosis with regurgitation and chronic dysrhythmia (unclear what kind), most recently in 10/2022 noted on echocardiogram to be moderate in nature. CT head without acute intracranial abnormalities and I do not suspect acute neurologic event as etiology. She also had vague abdominal and chest discomfort preceding the episode with negative troponin  levels, which leads to me consider vasovagal syncope as well. She reports postural dizziness at times but had been standing when the event decreasing my suspicion for orthostatic hypotension as primary cause. Interestingly she did get a reclast infusion less than 2 days ago; this was her first infusion. On review of side effects of this drug, hypotension is listed as well as arrhythmia; hypotension leading to syncope is noted but very rare. She also mentions a cardiac condition recently diagnosed in her 74 year old grandson but she does not know what--she mentions that he required defibrillation after a syncopal episode and also used the term "blue baby." Her description brings HCM to mind but I would not suspect this to have progressed in under 1 year since her last echocardiogram which showed normal LV size and wall thickness. My top concern right now is to rule out syncope related to aortic stenosis and regurgitation or new arrhythmia. Plan: -Admit to telemetry -Will discuss with cardiology -Repeat echocardiogram -Orthostatic VS  C2 avulsion fracture In C-collar. EDP contacted neurosurgery (Dr. Esperanza Richters) who recommended [place] J collar and OP f/u in 6 weeks for repeat imaging.   HLD Plan: -Continue home rosuvastatin 10 mg daily  Dispo: Admit patient to Observation with expected length of stay less than 2 midnights.  SignedChamp Mungo, DO 10/06/2023, 12:58 PM  After 5pm on weekdays and 1pm on weekends: On Call pager: [phone]

## 2023-10-06 NOTE — Progress Notes (Signed)
Spoke with neurosurgery team who recommended pt wear the C-collar at all teams and follow up in their office 6 weeks outpatient for repeat imaging. Pt can eat and proceed with activity as allowed by c-collar. She must maintain she collar at all times including during sleep. They do not plan on seeing patient while she is admitted.   Gwenevere Abbot, MD Eligha Bridegroom. [facility] Internal Medicine Residency, PGY-3

## 2023-10-07 ENCOUNTER — Observation Stay (HOSPITAL_BASED_OUTPATIENT_CLINIC_OR_DEPARTMENT_OTHER): Payer: Medicare Other

## 2023-10-07 DIAGNOSIS — I959 Hypotension, unspecified: Secondary | ICD-10-CM | POA: Diagnosis present

## 2023-10-07 DIAGNOSIS — R55 Syncope and collapse: Secondary | ICD-10-CM | POA: Diagnosis present

## 2023-10-07 DIAGNOSIS — D696 Thrombocytopenia, unspecified: Secondary | ICD-10-CM | POA: Diagnosis present

## 2023-10-07 DIAGNOSIS — W19XXXA Unspecified fall, initial encounter: Secondary | ICD-10-CM | POA: Diagnosis present

## 2023-10-07 DIAGNOSIS — S12100A Unspecified displaced fracture of second cervical vertebra, initial encounter for closed fracture: Principal | ICD-10-CM

## 2023-10-07 DIAGNOSIS — Z8349 Family history of other endocrine, nutritional and metabolic diseases: Secondary | ICD-10-CM | POA: Diagnosis not present

## 2023-10-07 DIAGNOSIS — I083 Combined rheumatic disorders of mitral, aortic and tricuspid valves: Secondary | ICD-10-CM | POA: Diagnosis present

## 2023-10-07 DIAGNOSIS — G43909 Migraine, unspecified, not intractable, without status migrainosus: Secondary | ICD-10-CM | POA: Diagnosis present

## 2023-10-07 DIAGNOSIS — I471 Supraventricular tachycardia, unspecified: Secondary | ICD-10-CM

## 2023-10-07 DIAGNOSIS — S01112A Laceration without foreign body of left eyelid and periocular area, initial encounter: Secondary | ICD-10-CM | POA: Diagnosis present

## 2023-10-07 DIAGNOSIS — I350 Nonrheumatic aortic (valve) stenosis: Secondary | ICD-10-CM | POA: Diagnosis not present

## 2023-10-07 DIAGNOSIS — Z79899 Other long term (current) drug therapy: Secondary | ICD-10-CM | POA: Diagnosis not present

## 2023-10-07 DIAGNOSIS — Z881 Allergy status to other antibiotic agents status: Secondary | ICD-10-CM | POA: Diagnosis not present

## 2023-10-07 DIAGNOSIS — Z87440 Personal history of urinary (tract) infections: Secondary | ICD-10-CM | POA: Diagnosis not present

## 2023-10-07 DIAGNOSIS — I4719 Other supraventricular tachycardia: Secondary | ICD-10-CM | POA: Diagnosis present

## 2023-10-07 DIAGNOSIS — E785 Hyperlipidemia, unspecified: Secondary | ICD-10-CM | POA: Diagnosis present

## 2023-10-07 DIAGNOSIS — Z882 Allergy status to sulfonamides status: Secondary | ICD-10-CM | POA: Diagnosis not present

## 2023-10-07 DIAGNOSIS — Z91040 Latex allergy status: Secondary | ICD-10-CM | POA: Diagnosis not present

## 2023-10-07 DIAGNOSIS — Z8249 Family history of ischemic heart disease and other diseases of the circulatory system: Secondary | ICD-10-CM | POA: Diagnosis not present

## 2023-10-07 LAB — ECHOCARDIOGRAM COMPLETE
AR max vel: 0.72 cm2
AV Area VTI: 0.82 cm2
AV Area mean vel: 0.71 cm2
AV Mean grad: 31 mm[Hg]
AV Peak grad: 55.7 mm[Hg]
AV Vena cont: 0.6 cm
Ao pk vel: 3.73 m/s
Area-P 1/2: 2.5 cm2
Height: 63 in
P 1/2 time: 498 ms
S' Lateral: 2.7 cm
Weight: 1920 [oz_av]

## 2023-10-07 LAB — BASIC METABOLIC PANEL
Anion gap: 9 (ref 5–15)
BUN: 15 mg/dL (ref 8–23)
CO2: 21 mmol/L — ABNORMAL LOW (ref 22–32)
Calcium: 8.9 mg/dL (ref 8.9–10.3)
Chloride: 105 mmol/L (ref 98–111)
Creatinine, Ser: 0.94 mg/dL (ref 0.44–1.00)
GFR, Estimated: >60 mL/min (ref >60)
Glucose, Bld: 101 mg/dL — ABNORMAL HIGH (ref 70–99)
Potassium: 3.7 mmol/L (ref 3.5–5.1)
Sodium: 135 mmol/L (ref 135–145)

## 2023-10-07 LAB — CBC
HCT: 37.6 % (ref 36.0–46.0)
Hemoglobin: 12.9 g/dL (ref 12.0–15.0)
MCH: 32 pg (ref 26.0–34.0)
MCHC: 34.3 g/dL (ref 30.0–36.0)
MCV: 93.3 fL (ref 80.0–100.0)
Platelets: 162 10*3/uL (ref 150–400)
RBC: 4.03 MIL/uL (ref 3.87–5.11)
RDW: 12.2 % (ref 11.5–15.5)
WBC: 8 10*3/uL (ref 4.0–10.5)
nRBC: 0 % (ref 0.0–0.2)

## 2023-10-07 NOTE — Progress Notes (Signed)
DAILY PROGRESS NOTE   Patient Name: Kayla Ward Date of Encounter: 10/07/2023 Cardiologist: None  Chief Complaint   No overnight events  Patient Profile   Kayla Ward is a 78 y.o. female with a hx of aortic stenosis, aortic regurgitation, hyperlipidemia, history of syncopal episodes who is being seen 10/06/2023 for the evaluation of syncope at the request of Dr. Ninetta Lights.   Subjective   [name] says she feels somewhat better today.  She has not yet seen neurosurgery regarding her neck fracture.  She is scheduled to have an echocardiogram today.  Telemetry showed several episodes of what appears to be atrial tachycardia overnight.   Objective   Vitals:   10/07/23 0500 10/07/23 0640 10/07/23 0642 10/07/23 0700  BP: 116/64 (!) 112/59  115/65  Pulse: 90 82  69  Resp: 16 (!) 25  19  Temp:   98.8 F (37.1 C)   TempSrc:   Oral   SpO2: 97% 99%  97%  Weight:      Height:       No intake or output data in the 24 hours ending 10/07/23 0821 Filed Weights   10/06/23 0901  Weight: 54.4 kg    Physical Exam   General appearance: alert and no distress Neck: no carotid bruit, no JVD, thyroid not enlarged, symmetric, no tenderness/mass/nodules, and in a hard neck collar Lungs: clear to auscultation bilaterally Heart: regular rate and rhythm, S1, S2 normal, and systolic murmur: systolic ejection 2/6, crescendo at 2nd right intercostal space Abdomen: soft, non-tender; bowel sounds normal; no masses,  no organomegaly Extremities: extremities normal, atraumatic, no cyanosis or edema Pulses: 2+ and symmetric Skin: Skin color, texture, turgor normal. No rashes or lesions Neurologic: Grossly normal Psych: Pleasant  Inpatient Medications    Scheduled Meds:  ascorbic acid  500 mg Oral BID WC   calcium-vitamin D  1 tablet Oral BID   rivaroxaban  10 mg Oral Daily   rosuvastatin  10 mg Oral Daily    Continuous Infusions:   PRN Meds: acetaminophen **OR** acetaminophen,  carboxymethylcellul-glycerin, diclofenac Sodium, famotidine, oxyCODONE, polyethylene glycol   Labs   Results for orders placed or performed during the hospital encounter of 10/06/23 (from the past 48 hours)  Basic metabolic panel     Status: None   Collection Time: 10/06/23  9:41 AM  Result Value Ref Range   Sodium 138 135 - 145 mmol/L   Potassium 4.0 3.5 - 5.1 mmol/L   Chloride 105 98 - 111 mmol/L   CO2 25 22 - 32 mmol/L   Glucose, Bld 96 70 - 99 mg/dL    Comment: Glucose reference range applies only to samples taken after fasting for at least 8 hours.   BUN 8 8 - 23 mg/dL   Creatinine, Ser 8.29 0.44 - 1.00 mg/dL   Calcium 9.1 8.9 - 56.2 mg/dL   GFR, Estimated >13 >08 mL/min    Comment: (NOTE) Calculated using the CKD-EPI Creatinine Equation (2021)    Anion gap 8 5 - 15    Comment: Performed at [facility], [address], [place], [place] [address]  CBC WITH DIFFERENTIAL     Status: Abnormal   Collection Time: 10/06/23  9:41 AM  Result Value Ref Range   WBC 6.2 4.0 - 10.5 K/uL   RBC 4.03 3.87 - 5.11 MIL/uL   Hemoglobin 13.0 12.0 - 15.0 g/dL   HCT 69.6 29.5 - 28.4 %   MCV 93.5 80.0 - 100.0  fL   MCH 32.3 26.0 - 34.0 pg   MCHC 34.5 30.0 - 36.0 g/dL   RDW 44.0 10.2 - 72.5 %   Platelets 145 (L) 150 - 400 K/uL   nRBC 0.0 0.0 - 0.2 %   Neutrophils Relative % 76 %   Neutro Abs 4.7 1.7 - 7.7 K/uL   Lymphocytes Relative 12 %   Lymphs Abs 0.7 0.7 - 4.0 K/uL   Monocytes Relative 10 %   Monocytes Absolute 0.6 0.1 - 1.0 K/uL   Eosinophils Relative 1 %   Eosinophils Absolute 0.1 0.0 - 0.5 K/uL   Basophils Relative 1 %   Basophils Absolute 0.0 0.0 - 0.1 K/uL   Immature Granulocytes 0 %   Abs Immature Granulocytes 0.02 0.00 - 0.07 K/uL    Comment: Performed at [facility], [address], [place], [place] [address]  Troponin I (High Sensitivity)     Status: None   Collection Time: 10/06/23  9:41 AM  Result Value Ref Range   Troponin I (High Sensitivity) 4 <18 ng/L     Comment: (NOTE) Elevated high sensitivity troponin I (hsTnI) values and significant  changes across serial measurements may suggest ACS but many other  chronic and acute conditions are known to elevate hsTnI results.  Refer to the "Links" section for chest pain algorithms and additional  guidance. Performed at [facility], [address] [place], [place] [address]   CBG monitoring, ED     Status: None   Collection Time: 10/06/23  9:46 AM  Result Value Ref Range   Glucose-Capillary 82 70 - 99 mg/dL    Comment: Glucose reference range applies only to samples taken after fasting for at least 8 hours.  Troponin I (High Sensitivity)     Status: None   Collection Time: 10/06/23 11:54 AM  Result Value Ref Range   Troponin I (High Sensitivity) 3 <18 ng/L    Comment: (NOTE) Elevated high sensitivity troponin I (hsTnI) values and significant  changes across serial measurements may suggest ACS but many other  chronic and acute conditions are known to elevate hsTnI results.  Refer to the "Links" section for chest pain algorithms and additional  guidance. Performed at [facility], [address] [place], [place] [address]   Urinalysis, Routine w reflex microscopic -Urine, Clean Catch     Status: Abnormal   Collection Time: 10/06/23  9:06 PM  Result Value Ref Range   Color, Urine YELLOW YELLOW   APPearance CLEAR CLEAR   Specific Gravity, Urine 1.010 1.005 - 1.030   pH 8.0 5.0 - 8.0   Glucose, UA NEGATIVE NEGATIVE mg/dL   Hgb urine dipstick NEGATIVE NEGATIVE   Bilirubin Urine NEGATIVE NEGATIVE   Ketones, ur 20 (A) NEGATIVE mg/dL   Protein, ur NEGATIVE NEGATIVE mg/dL   Nitrite NEGATIVE NEGATIVE   Leukocytes,Ua MODERATE (A) NEGATIVE   RBC / HPF 0-5 0 - 5 RBC/hpf   WBC, UA 11-20 0 - 5 WBC/hpf   Bacteria, UA RARE (A) NONE SEEN   Squamous Epithelial / HPF 0-5 0 - 5 /HPF    Comment: Performed at [facility], [address], [place], [place] [address]  CBC      Status: None   Collection Time: 10/07/23  2:34 AM  Result Value Ref Range   WBC 8.0 4.0 - 10.5 K/uL   RBC 4.03 3.87 - 5.11 MIL/uL   Hemoglobin 12.9 12.0 - 15.0 g/dL   HCT  37.6 36.0 - 46.0 %   MCV 93.3 80.0 - 100.0 fL   MCH 32.0 26.0 - 34.0 pg   MCHC 34.3 30.0 - 36.0 g/dL   RDW 40.9 81.1 - 91.4 %   Platelets 162 150 - 400 K/uL   nRBC 0.0 0.0 - 0.2 %    Comment: Performed at [facility], [address] [place], [place] [address]  Basic metabolic panel     Status: Abnormal   Collection Time: 10/07/23  2:34 AM  Result Value Ref Range   Sodium 135 135 - 145 mmol/L   Potassium 3.7 3.5 - 5.1 mmol/L   Chloride 105 98 - 111 mmol/L   CO2 21 (L) 22 - 32 mmol/L   Glucose, Bld 101 (H) 70 - 99 mg/dL    Comment: Glucose reference range applies only to samples taken after fasting for at least 8 hours.   BUN 15 8 - 23 mg/dL   Creatinine, Ser 6.21 0.44 - 1.00 mg/dL   Calcium 8.9 8.9 - 30.8 mg/dL   GFR, Estimated >65 >78 mL/min    Comment: (NOTE) Calculated using the CKD-EPI Creatinine Equation (2021)    Anion gap 9 5 - 15    Comment: Performed at [facility], [address], [place], [place] [address]    ECG   N/A  Telemetry   Sinus rhythm with several episodes of narrrow complex, regular tachycardia- probably atrial tachycardia - Personally Reviewed  Radiology    CT Head Wo Contrast Result Date: 10/06/2023 CLINICAL DATA:  Blunt trauma. EXAM: CT HEAD WITHOUT CONTRAST CT CERVICAL SPINE WITHOUT CONTRAST TECHNIQUE: Multidetector CT imaging of the head and cervical spine was performed following the standard protocol without intravenous contrast. Multiplanar CT image reconstructions of the cervical spine were also generated. RADIATION DOSE REDUCTION: This exam was performed according to the departmental dose-optimization program which includes automated exposure control, adjustment of the mA and/or kV according to patient size and/or use of iterative reconstruction technique.  COMPARISON:  None Available. FINDINGS: CT HEAD FINDINGS Brain: The ventricles and sulci are appropriate size for the patient's age. The gray-white matter discrimination is preserved. There is no acute intracranial hemorrhage. No mass effect or midline shift. No extra-axial fluid collection. Vascular: No hyperdense vessel or unexpected calcification. Skull: Normal. Negative for fracture or focal lesion. Sinuses/Orbits: There is chronic right maxillary sinus disease with partial opacification. No air-fluid level. The mastoid air cells are clear. Other: None CT CERVICAL SPINE FINDINGS Alignment: No acute subluxation. Skull base and vertebrae: Minimally displaced avulsion fracture of the anterior inferior corner of C2 vertebra consistent with an extension teardrop fracture. No other acute fracture. The bones are osteopenic. Soft tissues and spinal canal: No prevertebral fluid or swelling. No visible canal hematoma. Disc levels:  No acute findings.  Degenerative changes. Upper chest: No acute finding. Other: None IMPRESSION: 1. No acute intracranial pathology. 2. Minimally displaced avulsion fracture of the anterior base of the C2. These results were called by telephone at the time of interpretation on 10/06/2023 at 10:49 am to provider [facility] , who verbally acknowledged these results. Electronically Signed   By: Elgie Collard M.D.   On: 10/06/2023 10:53   CT Cervical Spine Wo Contrast Result Date: 10/06/2023 CLINICAL DATA:  Blunt trauma. EXAM: CT HEAD WITHOUT CONTRAST CT CERVICAL SPINE WITHOUT CONTRAST TECHNIQUE: Multidetector CT imaging of the head and cervical spine was performed following the standard protocol without intravenous contrast. Multiplanar CT image reconstructions of the cervical  spine were also generated. RADIATION DOSE REDUCTION: This exam was performed according to the departmental dose-optimization program which includes automated exposure control, adjustment of the mA and/or kV according to  patient size and/or use of iterative reconstruction technique. COMPARISON:  None Available. FINDINGS: CT HEAD FINDINGS Brain: The ventricles and sulci are appropriate size for the patient's age. The gray-white matter discrimination is preserved. There is no acute intracranial hemorrhage. No mass effect or midline shift. No extra-axial fluid collection. Vascular: No hyperdense vessel or unexpected calcification. Skull: Normal. Negative for fracture or focal lesion. Sinuses/Orbits: There is chronic right maxillary sinus disease with partial opacification. No air-fluid level. The mastoid air cells are clear. Other: None CT CERVICAL SPINE FINDINGS Alignment: No acute subluxation. Skull base and vertebrae: Minimally displaced avulsion fracture of the anterior inferior corner of C2 vertebra consistent with an extension teardrop fracture. No other acute fracture. The bones are osteopenic. Soft tissues and spinal canal: No prevertebral fluid or swelling. No visible canal hematoma. Disc levels:  No acute findings.  Degenerative changes. Upper chest: No acute finding. Other: None IMPRESSION: 1. No acute intracranial pathology. 2. Minimally displaced avulsion fracture of the anterior base of the C2. These results were called by telephone at the time of interpretation on 10/06/2023 at 10:49 am to provider [facility] , who verbally acknowledged these results. Electronically Signed   By: Elgie Collard M.D.   On: 10/06/2023 10:53    Cardiac Studies   Echo pending  Assessment   Principal Problem:   Syncope Active Problems:   Aortic valve stenosis   Plan   Ms. Bernabe was noted to have some episodes of atrial tachycardia overnight, non-sustained, not symptomatic. Not clear if this contributed to her syncope. No bradyarrhythmias. Await echo today- may consider starting low dose beta blocker - could place 30 day monitor at d/c or given the infrequency of her epidsodes, consider ILR.  Time Spent Directly with  Patient:  I have spent a total of 25 minutes with the patient reviewing hospital notes, telemetry, EKGs, labs and examining the patient as well as establishing an assessment and plan that was discussed personally with the patient.  > 50% of time was spent in direct patient care.  Length of Stay:  LOS: 0 days   Chrystie Nose, MD, [facility] FACP  [facility]  Medical Director of the Advanced Lipid Disorders &  Cardiovascular Risk Reduction Clinic Diplomate of the American Board of Clinical Lipidology Attending Cardiologist  Direct Dial: [phone]  Fax: [phone]  Website:  [email or web address]  Lisette Abu Malikah Principato 10/07/2023, 8:21 AM

## 2023-10-07 NOTE — Plan of Care (Signed)

## 2023-10-07 NOTE — Care Management Obs Status (Signed)
MEDICARE OBSERVATION STATUS NOTIFICATION   Patient Details  Name: Kayla Ward MRN: [id number] Date of Birth: 1945-12-03   Medicare Observation Status Notification Given:  Yes    Oletta Cohn, RN 10/07/2023, 8:59 AM

## 2023-10-07 NOTE — Progress Notes (Signed)
*  PRELIMINARY RESULTS* Echocardiogram 2D Echocardiogram has been performed.  Earlie Server Quetzally Callas 10/07/2023, 1:07 PM

## 2023-10-07 NOTE — Progress Notes (Signed)
Summary: Kayla Ward is a 78 y.o. F with PMH aortic stenosis with regurgitation, multiple syncopal episodes, HLD, who presents after a syncopal episode at home and admitted for syncope.   Hospital Day: 1  Subjective:  Patient seen and evaluated while resting in bed. Patient denies CP, feelings of heart palpitations, SOB, dizziness or lightheadedness. She reports she is eating and drinking ok.  Objective:  Vital signs in last 24 hours: Vitals:   10/07/23 0430 10/07/23 0500 10/07/23 0640 10/07/23 0642  BP: 113/60 116/64 (!) 112/59   Pulse: 71 90 82   Resp: 15 16 (!) 25   Temp:    98.8 F (37.1 C)  TempSrc:    Oral  SpO2: 98% 97% 99%   Weight:      Height:       Physical Exam Cardiovascular:     Rate and Rhythm: Normal rate and regular rhythm.     Heart sounds: Murmur heard.  Pulmonary:     Effort: Pulmonary effort is normal.     Breath sounds: Normal breath sounds.  Abdominal:     General: Bowel sounds are normal.     Palpations: Abdomen is soft.  Neurological:     Mental Status: She is alert.   Weight change:  No intake or output data in the 24 hours ending 10/07/23 0657     Latest Ref Rng & Units 10/07/2023    2:34 AM 10/06/2023    9:41 AM 11/10/2021    2:40 PM  CMP  Glucose 70 - 99 mg/dL 161  96    BUN 8 - 23 mg/dL 15  8    Creatinine 0.96 - 1.00 mg/dL 0.45  4.09    Sodium 811 - 145 mmol/L 135  138  144   Potassium 3.5 - 5.1 mmol/L 3.7  4.0  3.5   Chloride 98 - 111 mmol/L 105  105    CO2 22 - 32 mmol/L 21  25    Calcium 8.9 - 10.3 mg/dL 8.9  9.1     U/A: Moderate leukocytes Rare bacteria   Echo IMPRESSIONS   1. Left ventricular ejection fraction, by estimation, is 60 to 65%. The left ventricle has normal function. The left ventricle has no regional wall motion abnormalities. Left ventricular diastolic parameters are consistent with Grade I diastolic dysfunction (impaired relaxation).   2. Right ventricular systolic function is normal. The right  ventricular size is normal.   3. The mitral valve is abnormal. Mild mitral valve regurgitation. No evidence of mitral stenosis.   4. Tricuspid valve regurgitation is moderate.   5. Partial fusion of right/left cusps. The aortic valve is calcified. There is severe calcifcation of the aortic valve. There is severe thickening of the aortic valve. Aortic valve regurgitation is moderate. Moderate to severe aortic valve stenosis.   6. The inferior vena cava is normal in size with greater than 50% respiratory variability, suggesting right atrial pressure of 3 mmHg.   Assessment/Plan:  Principal Problem:   Syncope Active Problems:   Aortic valve stenosis  Kayla Ward is a 78 y.o. F with PMH aortic stenosis with regurgitation, multiple syncopal episodes, HLD, who presents after a syncopal episode at home and admitted for syncope on hospital day 1.  Syncope Continue to suspect vasovagal or cardiac arrhythmia as source of syncope. Cardiology following, appreciate recommendations. Patient's echo revealed moderate to severe aortic stenosis with normal ejection fraction. Will wait and see cardiology's recommendation regarding next steps with echo results -  Telemetry - Cardiology following, appreciate recommendations  C2 Avulsion fracture Team contacted neurosurgery yesterday and they recommend C-collar for 6 weeks and to follow with them outpatient for repeat imaging.  - Continue c-collar  HLD - Home rosuvastatin 10 mg daily   Code: Full  Diet: Heart Fluids: None VTE Prophylaxis: Riveroxaban   LOS: 0 days   Bubba Hales, Medical Student 10/07/2023, 6:57 AM

## 2023-10-08 ENCOUNTER — Other Ambulatory Visit: Payer: Self-pay | Admitting: Cardiology

## 2023-10-08 DIAGNOSIS — R55 Syncope and collapse: Secondary | ICD-10-CM | POA: Diagnosis not present

## 2023-10-08 DIAGNOSIS — I350 Nonrheumatic aortic (valve) stenosis: Secondary | ICD-10-CM | POA: Diagnosis not present

## 2023-10-08 DIAGNOSIS — S12100A Unspecified displaced fracture of second cervical vertebra, initial encounter for closed fracture: Secondary | ICD-10-CM | POA: Diagnosis not present

## 2023-10-08 MED ORDER — OXYCODONE HCL 5 MG PO TABS: 5.0000 mg | ORAL_TABLET | Freq: Four times a day (QID) | ORAL | 0 refills | Status: AC | PRN

## 2023-10-08 NOTE — Plan of Care (Signed)

## 2023-10-08 NOTE — Progress Notes (Signed)
   Patient Name: Kayla Ward Date of Encounter: 10/08/2023 [facility] Cardiologist: Rosemary Holms  Interval Summary  .    No new problems.  No major arrhythmias on monitor.  Denies palpitations, dizziness or syncope. Echo once again shows moderate aortic stenosis.  Although this has progressed from the previous echocardiogram, it remains squarely in the moderate range. The history is most consistent with vasovagal syncope (not feeling well for a couple of days after bisphosphonate infusion, occurred early in the morning, shortly after using the bathroom, preceded by nausea and change in visual acuity, similar to the 2 other events that occurred in the setting of urinary tract infection with similar prodrome)  Vital Signs .    Vitals:   10/07/23 2339 10/08/23 0345 10/08/23 0814 10/08/23 1137  BP: 105/66 116/62 (!) 120/55   Pulse: 72 74    Resp: 20 18 18 18   Temp: 97.9 F (36.6 C) 98 F (36.7 C) 98.2 F (36.8 C) (!) 97.3 F (36.3 C)  TempSrc: Oral Oral Oral Oral  SpO2:      Weight:      Height:        Intake/Output Summary (Last 24 hours) at 10/08/2023 1304 Last data filed at 10/07/2023 2130 Gross per 24 hour  Intake 960 ml  Output --  Net 960 ml      10/07/2023    1:12 PM 10/06/2023    9:01 AM 11/26/2022    1:22 PM  Last 3 Weights  Weight (lbs) 119 lb 14.4 oz 120 lb 126 lb 12.8 oz  Weight (kg) 54.386 kg 54.432 kg 57.516 kg      Telemetry/ECG    Normal sinus rhythm- Personally Reviewed  Physical Exam .   GEN: No acute distress.   Neck: No JVD Cardiac: RRR, normal S1 and still has a very distinct second heart sound, 2-3/6 aortic ejection murmur, no diastolic murmurs, rubs, or gallops.  Respiratory: Clear to auscultation bilaterally. GI: Soft, nontender, non-distended  MS: No edema  Assessment & Plan .     History is most consistent with vasovagal syncope.  Syncope was not exertional.  No meaningful arrhythmia identified so far. Although the moderate  aortic stenosis may increase the likelihood that a vasovagal event will proceed all the way to syncope by making hypotension more likely, aortic stenosis is not itself the cause for her syncopal event. Aortic stenosis is progressing slowly and it is likely that she will develop symptoms and require valve replacement in the next 2-3 years Discussed staying well-hydrated, eating a relatively liberal amount of salt in her diet, avoiding triggers for her syncope and most importantly laying flat immediately when she has the first prodromal symptoms, before she loses consciousness completely. Cancel the echocardiogram that was scheduled for February 10 since she had it done here in the hospital.  Will have her wear a 14-day event monitor.  She already has an appointment scheduled with Dr. Rosemary Holms 11/21/2023. Ready for discharge from cardiology point of view.  For questions or updates, please contact [facility] Please consult [email or web address] for contact info under        Signed, Thurmon Fair, MD

## 2023-10-08 NOTE — Progress Notes (Addendum)
   Overview:  Kayla Ward is a 78 y.o. F with PMH aortic stenosis with regurgitation, multiple syncopal episodes, HLD, who presents after a syncopal episode at home and admitted for syncope.   Overnight:NAEON  Subjective: Doing well, no acute complaints. Wants to go home soon.   Objective:  Vital signs in last 24 hours: Vitals:   10/07/23 1625 10/07/23 1959 10/07/23 2339 10/08/23 0345  BP: 132/69 114/73 105/66 116/62  Pulse: 84 79 72 74  Resp: 18 20 20 18   Temp: 97.7 F (36.5 C) 98.7 F (37.1 C) 97.9 F (36.6 C) 98 F (36.7 C)  TempSrc: Oral Oral Oral Oral  SpO2: 93% 92%    Weight:      Height:       Supplemental O2: Room Air Last BM Date : 10/07/23 SpO2: 92 % Filed Weights   10/06/23 0901 10/07/23 1312  Weight: 54.4 kg 54.4 kg    Intake/Output Summary (Last 24 hours) at 10/08/2023 0606 Last data filed at 10/07/2023 2130 Gross per 24 hour  Intake 960 ml  Output --  Net 960 ml   Net IO Since Admission: 960 mL [10/08/23 0606]  Physical Exam General: NAD HENT: NCAT Lungs: breathing comfortably on room air Cardiovascular: RRR, systolic murmur in aortic region 3/6 MSK: no asymmetry Skin: no skin lesions on examined skin Neuro: alert and oriented x4 Psych: normal mood and normal affect  No new labs or imaging ordered due to clinical stability.    Assessment/Plan: Principal Problem:   Syncope Active Problems:   Aortic valve stenosis   Traumatic closed fracture of C2 vertebra with minimal displacement, initial encounter (HCC)  Syncope Moderate to Severe Aortic Valve stenosis Continue to suspect vasovagal or cardiac arrhythmia as source of syncope. Cardiology following, appreciate recommendations. Patient's echo revealed moderate to severe aortic stenosis with normal ejection fraction which has progressed from previous echocardiograms. Will wait and see cardiology's recommendation regarding next steps with echo results and final recommendation.  -  Telemetry - Cardiology following, appreciate recommendations   C2 Avulsion fracture Stable. NSG recommends C-collar for 6 weeks and to follow with them outpatient for repeat imaging. Will provide pt with contact information so she can schedule a follow up appointment with them. Will place PT consult as well to help pt maneuver safely with c-collar.  - Continue c-collar   HLD - Home rosuvastatin 10 mg daily   Diet: Heart Healthy IVF: None VTE: Enoxaparin Code: Full PT/OT recs: , pending Prior to Admission Living Arrangement: Home Anticipated Discharge Location: Home Barriers to Discharge: Medical Stability Dispo: Anticipated discharge in approximately 1 day(s).   Gwenevere Abbot, MD Eligha Bridegroom. [facility] Internal Medicine Residency, PGY-3  Pager: [phone] After 5 pm and on weekends: Please call the on-call pager

## 2023-10-08 NOTE — Discharge Instructions (Addendum)
You came to the ED after a syncopal episode. We repeated an ultrasound of the heart that showed slight worsening of the your aortic stenosis. We also consulted heart doctors that think this syncopal episode was similar to your previous ones and not caused by your heart going out of rhythm. Your cardiologist is sending a patch to place to monitor your heart rhythm. You should get this in the mail.   Please follow up with your PCP in 7-14 days and your cardiologist as scheduled. I am filling a short course of oxycodone that you can take for severe pain. I would like you to call the neurosurgery office on Monday to make a follow up appointment in 6 weeks. Please keep the neck brace on your neck at all times.

## 2023-10-08 NOTE — Progress Notes (Signed)
Ordered 14 day zio for evaluation of syncope. Monitor to be read by Dr. Venita Sheffield, PA-C 10/08/2023 1:12 PM

## 2023-10-09 NOTE — Discharge Summary (Signed)
Name: Kayla Ward MRN: [id number] DOB: 12-Aug-1946 78 y.o. PCP: Gwenlyn Found, MD  Date of Admission: 10/06/2023  8:55 AM Date of Discharge: 10/08/2023  Attending Physician: Dr. Cleda Daub  DISCHARGE DIAGNOSIS:  Primary Problem: Syncope   Hospital Problems: Principal Problem:   Syncope Active Problems:   Moderate aortic stenosis   Traumatic closed fracture of C2 vertebra with minimal displacement, initial encounter (HCC)    DISCHARGE MEDICATIONS:   Allergies as of 10/08/2023       Reactions   Bactrim [sulfamethoxazole-trimethoprim] Hives, Nausea And Vomiting, Other (See Comments)   Syncope    Sulfa Antibiotics Hives   Latex Rash        Medication List     TAKE these medications    acetaminophen 500 MG tablet Commonly known as: TYLENOL Take 1,000 mg by mouth daily as needed for pain or headache.   ACT Dry Mouth Lozg Use as directed 1 lozenge in the mouth or throat 2 (two) times daily as needed (dry mouth).   ascorbic acid 500 MG tablet Commonly known as: VITAMIN C Take 500 mg by mouth 2 (two) times daily with a meal.   carboxymethylcellulose 0.5 % Soln Commonly known as: REFRESH PLUS Place 1 drop into both eyes daily as needed (dry eyes).   cetirizine 10 MG tablet Commonly known as: ZYRTEC Take 10 mg by mouth daily as needed for allergies.   diclofenac sodium 1 % Gel Commonly known as: VOLTAREN Apply 2 g topically daily as needed (right knee pain).   famotidine-calcium carbonate-magnesium hydroxide 10-800-165 MG chewable tablet Commonly known as: PEPCID COMPLETE Chew 1 tablet by mouth daily as needed (heartburn, indigestion).   oxyCODONE 5 MG immediate release tablet Commonly known as: Oxy IR/ROXICODONE Take 1 tablet (5 mg total) by mouth every 6 (six) hours as needed for up to 3 days for moderate pain (pain score 4-6) or severe pain (pain score 7-10).   rosuvastatin 10 MG tablet Commonly known as: CRESTOR Take 1 tablet (10 mg total) by  mouth daily.   SUMAtriptan 50 MG tablet Commonly known as: IMITREX Take 50 mg by mouth daily as needed for migraine.   VIACTIV CALCIUM PLUS D PO Take 1 each by mouth 2 (two) times daily with a meal.        DISPOSITION AND FOLLOW-UP:  Kayla Ward was discharged from [facility] in Stable condition. At the hospital follow up visit please address:  Syncope Assess for additional episodes of syncope/presyncope   Follow-up Appointments:  Follow-up Information     [facility]. Call in 2 day(s).   Specialty: Neurosurgery Why: Call to make a follow up appoientment. Contact information: [address] [place] [address] [phone]         Gwenlyn Found, MD. Call in 2 day(s).   Specialty: Family Medicine Why: Call to make an appointment in 7-14 days. Contact information: [address] [place] [address] [phone]         Elder Negus, MD. Go to.   Specialties: Cardiology, Radiology Why: Go to your scheduled appointment with your cardiologist. Contact information: [address] [place] [address] [phone]                 HOSPITAL COURSE:  Patient Summary:  Admission HPI: Kayla Ward is a 78 y.o. F with PMH aortic stenosis with regurgitation, multiple syncopal episodes, HLD, who presents  after a syncopal episode at home.   She explains that she had been up and moving for the morning, including using the restroom and then at the sink washing her hands when she felt some abdominal discomfort and woke up on the ground. She does not recall dizziness or headaches preceding this event but did have a cramping sensation in her chest that actually began last night and was present this morning on the L side of her chest but without radiation. She denies experiencing palpitations.    She explains that her prior syncopal episodes were  also preceded by stomach discomfort. She has had vision changes with episodes in the past but no real changes that she can recall with this episode.    She denies focal weakness, pain. She was able to stand for her xray on arrival. She denies incontinence or tongue biting during the syncopal episode.    She does not think the frequency of syncopal episodes has increased. She has been eating and drinking well and denies recent illnesses. She does have to move slowly when changing positions to avoid postural dizziness. She also notes occasional migraines where she has symptoms of dizziness, decreased vision and floaters, head pain, which usually go away with rest and placing an ice pack on her neck. She had a migraine earlier this week but does not think these symptoms are related.    Interestingly she did have her first reclast infusion Tuesday afternoon. She has felt fine until this morning since the infusion.   In regards to her history of aortic stenosis and regurgitation, she is due to see her cardiologist in February. She denies dyspnea at this time but describes occasionally needing to take a deep gasp of air because she feels like she needs oxygen. She did have this sensation after the syncopal episode today but at this time is asymptomatic.   Patient subsequently admitted for further work-up.  Syncope Moderate to Severe Aortic Valve stenosis Cardiology consulted and proceeded with TTE which showed progression of her aortic stenosis from previously known moderate to moderate-severe.  However, this was not thought to be contributing to her syncopal episodes as story/sensation is most consistent with vasovagal syncope.  Patient remained on telemetry during admission and, aside from 1 episode of unsustained SVT, telemetry unremarkable.  Patient denied syncope/presyncope during admission.  Patient discharged with 14-day Zio patch and will follow-up with cardiology 11/21/2023.  C2 Avulsion  fracture As noted on CT cervical spine this admission and 2/2 syncopal episode/fall.  Neurosurgery consulted and recommended C-collar for 6 weeks and to follow with them outpatient for repeat imaging.  DISCHARGE INSTRUCTIONS:   Discharge Instructions     Call MD for:  difficulty breathing, headache or visual disturbances   Complete by: As directed    Call MD for:  extreme fatigue   Complete by: As directed    Call MD for:  hives   Complete by: As directed    Call MD for:  persistant dizziness or light-headedness   Complete by: As directed    Call MD for:  persistant nausea and vomiting   Complete by: As directed    Call MD for:  redness, tenderness, or signs of infection (pain, swelling, redness, odor or green/yellow discharge around incision site)   Complete by: As directed    Call MD for:  severe uncontrolled pain   Complete by: As directed    Call MD for:  temperature >100.4   Complete by: As directed  Diet - low sodium heart healthy   Complete by: As directed    Increase activity slowly   Complete by: As directed    Keep neck brace on at all times.       SUBJECTIVE:  Doing well, no acute complaints. Wants to go home soon  Discharge Vitals:   BP (!) 120/55 (BP Location: Right Arm)   Pulse 74   Temp (!) 97.3 F (36.3 C) (Oral)   Resp 18   Ht 5\' 3"  (1.6 m)   Wt 54.4 kg   SpO2 92%   BMI 21.24 kg/m   OBJECTIVE:  Physical Exam General: NAD HENT: NCAT Lungs: breathing comfortably on room air Cardiovascular: RRR, systolic murmur in aortic region 3/6 MSK: no asymmetry Skin: no skin lesions on examined skin Neuro: alert and oriented x4 Psych: normal mood and normal affect  Pertinent Labs, Studies, and Procedures:     Latest Ref Rng & Units 10/07/2023    2:34 AM 10/06/2023    9:41 AM 11/10/2021    2:40 PM  CBC  WBC 4.0 - 10.5 K/uL 8.0  6.2    Hemoglobin 12.0 - 15.0 g/dL 21.3  08.6  57.8   Hematocrit 36.0 - 46.0 % 37.6  37.7  34.0   Platelets 150 - 400 K/uL  162  145         Latest Ref Rng & Units 10/07/2023    2:34 AM 10/06/2023    9:41 AM 11/10/2021    2:40 PM  CMP  Glucose 70 - 99 mg/dL 469  96    BUN 8 - 23 mg/dL 15  8    Creatinine 6.29 - 1.00 mg/dL 5.28  4.13    Sodium 244 - 145 mmol/L 135  138  144   Potassium 3.5 - 5.1 mmol/L 3.7  4.0  3.5   Chloride 98 - 111 mmol/L 105  105    CO2 22 - 32 mmol/L 21  25    Calcium 8.9 - 10.3 mg/dL 8.9  9.1      ECHOCARDIOGRAM COMPLETE Result Date: 10/07/2023    ECHOCARDIOGRAM REPORT   Patient Name:   LOWANDA GILNER Date of Exam: 10/07/2023 Medical Rec #:  [id number]        Height:       63.0 in Accession #:    [id number]       Weight:       120.0 lb Date of Birth:  1946/02/17        BSA:          1.556 m Patient Age:    77 years         BP:           114/60 mmHg Patient Gender: F                HR:           68 bpm. Exam Location:  Inpatient Procedure: 2D Echo, Cardiac Doppler and Color Doppler Indications:    Syncope; AV stenosis  History:        Patient has prior history of Echocardiogram examinations, most                 recent 11/15/2022. Aortic Valve Disease, Signs/Symptoms:Syncope;                 Risk Factors:Dyslipidemia and Non-Smoker.  Sonographer:    Dondra Prader RVT RCS Referring Phys: EMILY DEAN IMPRESSIONS  1. Left ventricular ejection fraction, by  estimation, is 60 to 65%. The left ventricle has normal function. The left ventricle has no regional wall motion abnormalities. Left ventricular diastolic parameters are consistent with Grade I diastolic dysfunction (impaired relaxation).  2. Right ventricular systolic function is normal. The right ventricular size is normal.  3. The mitral valve is abnormal. Mild mitral valve regurgitation. No evidence of mitral stenosis.  4. Tricuspid valve regurgitation is moderate.  5. Partial fusion of right/left cusps. The aortic valve is calcified. There is severe calcifcation of the aortic valve. There is severe thickening of the aortic valve. Aortic valve  regurgitation is moderate. Moderate to severe aortic valve stenosis.  6. The inferior vena cava is normal in size with greater than 50% respiratory variability, suggesting right atrial pressure of 3 mmHg. FINDINGS  Left Ventricle: Left ventricular ejection fraction, by estimation, is 60 to 65%. The left ventricle has normal function. The left ventricle has no regional wall motion abnormalities. The left ventricular internal cavity size was normal in size. There is  no left ventricular hypertrophy. Left ventricular diastolic parameters are consistent with Grade I diastolic dysfunction (impaired relaxation). Right Ventricle: The right ventricular size is normal. No increase in right ventricular wall thickness. Right ventricular systolic function is normal. Left Atrium: Left atrial size was normal in size. Right Atrium: Right atrial size was normal in size. Pericardium: There is no evidence of pericardial effusion. Mitral Valve: The mitral valve is abnormal. There is mild thickening of the mitral valve leaflet(s). There is mild calcification of the mitral valve leaflet(s). Mild mitral valve regurgitation. No evidence of mitral valve stenosis. Tricuspid Valve: The tricuspid valve is normal in structure. Tricuspid valve regurgitation is moderate . No evidence of tricuspid stenosis. Aortic Valve: Partial fusion of right/left cusps. The aortic valve is calcified. There is severe calcifcation of the aortic valve. There is severe thickening of the aortic valve. Aortic valve regurgitation is moderate. Aortic regurgitation PHT measures 498 msec. Moderate to severe aortic stenosis is present. Aortic valve mean gradient measures 31.0 mmHg. Aortic valve peak gradient measures 55.7 mmHg. Aortic valve area, by VTI measures 0.82 cm. Pulmonic Valve: The pulmonic valve was normal in structure. Pulmonic valve regurgitation is not visualized. No evidence of pulmonic stenosis. Aorta: The aortic root is normal in size and structure.  Venous: The inferior vena cava is normal in size with greater than 50% respiratory variability, suggesting right atrial pressure of 3 mmHg. IAS/Shunts: No atrial level shunt detected by color flow Doppler.  LEFT VENTRICLE PLAX 2D LVIDd:         3.85 cm   Diastology LVIDs:         2.70 cm   LV e' medial:    6.96 cm/s LV PW:         0.90 cm   LV E/e' medial:  7.0 LV IVS:        0.90 cm   LV e' lateral:   8.92 cm/s LVOT diam:     1.80 cm   LV E/e' lateral: 5.4 LV SV:         61 LV SV Index:   39 LVOT Area:     2.54 cm  RIGHT VENTRICLE             IVC RV Basal diam:  3.25 cm     IVC diam: 1.00 cm RV S prime:     13.30 cm/s TAPSE (M-mode): 2.5 cm LEFT ATRIUM  Index        RIGHT ATRIUM           Index LA Vol (A2C):   42.5 ml 27.31 ml/m  RA Area:     14.40 cm LA Vol (A4C):   26.4 ml 16.96 ml/m  RA Volume:   33.80 ml  21.72 ml/m LA Biplane Vol: 38.0 ml 24.42 ml/m  AORTIC VALVE                     PULMONIC VALVE AV Area (Vmax):    0.72 cm      PV Vmax:       1.10 m/s AV Area (Vmean):   0.71 cm      PV Peak grad:  4.8 mmHg AV Area (VTI):     0.82 cm AV Vmax:           373.00 cm/s AV Vmean:          257.000 cm/s AV VTI:            0.744 m AV Peak Grad:      55.7 mmHg AV Mean Grad:      31.0 mmHg LVOT Vmax:         105.00 cm/s LVOT Vmean:        71.300 cm/s LVOT VTI:          0.239 m LVOT/AV VTI ratio: 0.32 AI PHT:            498 msec AR Vena Contracta: 0.60 cm  AORTA Ao Root diam: 3.10 cm MITRAL VALVE               TRICUSPID VALVE MV Area (PHT): 2.50 cm    TR Peak grad:   23.8 mmHg MV Decel Time: 304 msec    TR Vmax:        244.00 cm/s MV E velocity: 48.50 cm/s MV A velocity: 83.30 cm/s  SHUNTS MV E/A ratio:  0.58        Systemic VTI:  0.24 m                            Systemic Diam: 1.80 cm Charlton Haws MD Electronically signed by Charlton Haws MD Signature Date/Time: 10/07/2023/1:45:13 PM    Final    CT Head Wo Contrast Result Date: 10/06/2023 CLINICAL DATA:  Blunt trauma. EXAM: CT HEAD WITHOUT CONTRAST  CT CERVICAL SPINE WITHOUT CONTRAST TECHNIQUE: Multidetector CT imaging of the head and cervical spine was performed following the standard protocol without intravenous contrast. Multiplanar CT image reconstructions of the cervical spine were also generated. RADIATION DOSE REDUCTION: This exam was performed according to the departmental dose-optimization program which includes automated exposure control, adjustment of the mA and/or kV according to patient size and/or use of iterative reconstruction technique. COMPARISON:  None Available. FINDINGS: CT HEAD FINDINGS Brain: The ventricles and sulci are appropriate size for the patient's age. The gray-white matter discrimination is preserved. There is no acute intracranial hemorrhage. No mass effect or midline shift. No extra-axial fluid collection. Vascular: No hyperdense vessel or unexpected calcification. Skull: Normal. Negative for fracture or focal lesion. Sinuses/Orbits: There is chronic right maxillary sinus disease with partial opacification. No air-fluid level. The mastoid air cells are clear. Other: None CT CERVICAL SPINE FINDINGS Alignment: No acute subluxation. Skull base and vertebrae: Minimally displaced avulsion fracture of the anterior inferior corner of C2 vertebra consistent with an extension teardrop fracture. No other acute  fracture. The bones are osteopenic. Soft tissues and spinal canal: No prevertebral fluid or swelling. No visible canal hematoma. Disc levels:  No acute findings.  Degenerative changes. Upper chest: No acute finding. Other: None IMPRESSION: 1. No acute intracranial pathology. 2. Minimally displaced avulsion fracture of the anterior base of the C2. These results were called by telephone at the time of interpretation on 10/06/2023 at 10:49 am to provider [facility] , who verbally acknowledged these results. Electronically Signed   By: Elgie Collard M.D.   On: 10/06/2023 10:53   CT Cervical Spine Wo Contrast Result Date:  10/06/2023 CLINICAL DATA:  Blunt trauma. EXAM: CT HEAD WITHOUT CONTRAST CT CERVICAL SPINE WITHOUT CONTRAST TECHNIQUE: Multidetector CT imaging of the head and cervical spine was performed following the standard protocol without intravenous contrast. Multiplanar CT image reconstructions of the cervical spine were also generated. RADIATION DOSE REDUCTION: This exam was performed according to the departmental dose-optimization program which includes automated exposure control, adjustment of the mA and/or kV according to patient size and/or use of iterative reconstruction technique. COMPARISON:  None Available. FINDINGS: CT HEAD FINDINGS Brain: The ventricles and sulci are appropriate size for the patient's age. The gray-white matter discrimination is preserved. There is no acute intracranial hemorrhage. No mass effect or midline shift. No extra-axial fluid collection. Vascular: No hyperdense vessel or unexpected calcification. Skull: Normal. Negative for fracture or focal lesion. Sinuses/Orbits: There is chronic right maxillary sinus disease with partial opacification. No air-fluid level. The mastoid air cells are clear. Other: None CT CERVICAL SPINE FINDINGS Alignment: No acute subluxation. Skull base and vertebrae: Minimally displaced avulsion fracture of the anterior inferior corner of C2 vertebra consistent with an extension teardrop fracture. No other acute fracture. The bones are osteopenic. Soft tissues and spinal canal: No prevertebral fluid or swelling. No visible canal hematoma. Disc levels:  No acute findings.  Degenerative changes. Upper chest: No acute finding. Other: None IMPRESSION: 1. No acute intracranial pathology. 2. Minimally displaced avulsion fracture of the anterior base of the C2. These results were called by telephone at the time of interpretation on 10/06/2023 at 10:49 am to provider [facility] , who verbally acknowledged these results. Electronically Signed   By: Elgie Collard M.D.   On:  10/06/2023 10:53     Signed: Carmina Miller, DO  Internal Medicine Resident, PGY-1 [facility]  Pager: [phone] 1:06 PM, 10/09/2023

## 2023-10-10 ENCOUNTER — Ambulatory Visit: Payer: Medicare Other | Attending: Cardiology

## 2023-10-10 DIAGNOSIS — R55 Syncope and collapse: Secondary | ICD-10-CM

## 2023-10-10 NOTE — Progress Notes (Unsigned)
Enrolled for [employer] to mail a ZIO XT long term holter monitor to the patients address on file.   Dr. Rosemary Holms to read.

## 2023-10-13 DIAGNOSIS — R55 Syncope and collapse: Secondary | ICD-10-CM

## 2023-10-31 ENCOUNTER — Other Ambulatory Visit (HOSPITAL_COMMUNITY): Payer: Medicare Other

## 2023-10-31 ENCOUNTER — Other Ambulatory Visit: Payer: Medicare Other

## 2023-11-11 ENCOUNTER — Telehealth: Payer: Self-pay

## 2023-11-11 NOTE — Telephone Encounter (Signed)
 Called patient advised of below they verbalized understanding.

## 2023-11-11 NOTE — Telephone Encounter (Signed)
-----   Message from Jonita Albee sent at 11/09/2023  1:47 PM EST ----- Please tell patient that her cardiac monitor showed predominantly normal sinus rhythm with HR 52-125 BPM, average 71 BPM. There were 62 episodes of SVT/atrial tachycardia (fast HR), longest episode lasted 16 seconds. Overall, no findings on cardiac monitor that would have caused her to lose consciousness. When seen on 3/3 with Dr. Rosemary Holms, may consider starting a medication to prevent fast hr depending on her blood pressure   Thanks KJ

## 2023-11-21 ENCOUNTER — Ambulatory Visit: Payer: Medicare Other | Attending: Cardiology | Admitting: Cardiology

## 2023-11-21 ENCOUNTER — Encounter: Payer: Self-pay | Admitting: Cardiology

## 2023-11-21 VITALS — BP 114/62 | HR 78 | Ht 63.0 in | Wt 119.4 lb

## 2023-11-21 DIAGNOSIS — R55 Syncope and collapse: Secondary | ICD-10-CM | POA: Diagnosis present

## 2023-11-21 DIAGNOSIS — I350 Nonrheumatic aortic (valve) stenosis: Secondary | ICD-10-CM | POA: Insufficient documentation

## 2023-11-21 DIAGNOSIS — E782 Mixed hyperlipidemia: Secondary | ICD-10-CM | POA: Insufficient documentation

## 2023-11-21 NOTE — Progress Notes (Signed)
 Cardiology Office Note:  .   Date:  11/21/2023  ID:  NYNA CHILTON, DOB 02-02-1946, MRN [id number] PCP: Gwenlyn Found, MD  [facility] Providers Cardiologist:  Truett Mainland, MD PCP: Gwenlyn Found, MD  Chief Complaint  Patient presents with   Loss of Consciousness      History of Present Illness: .    Kayla Ward is a 78 y.o. female with hyperlipidemia, moderate aortic stenosis and moderate aortic regurgitation.   Patient had a syncopal event in 09/2023 that unfortunately led to cervical spine C2 injury.  She had been wearing cervical neck collar for 6 weeks, now allowed to be taken off.  She does not have any neurological symptoms from this.  Stated syncopal event occurred while she was standing in her bathroom, with only mild lightheadedness.  She had received her osteoporosis injection infusion just 2 days ago, which can lead to dehydration.  Echocardiogram showed moderate to severe aortic stenosis, moderate aortic regurgitation, with slow progression compared to previous echocardiogram, but not at this stage where aortic valve replacement would be recommended.  Onset of this episode, she denies any symptoms of exertional dyspnea, chest pain, leg edema.  2-week monitor showed episode of SVT, but no significant arrhythmia that would explain syncope    Vitals:   11/21/23 1121  BP: 114/62  Pulse: 78  SpO2: 97%     ROS:  Review of Systems  Cardiovascular:  Positive for syncope. Negative for chest pain, dyspnea on exertion, leg swelling and palpitations.     Studies Reviewed: Marland Kitchen        Independently interpreted 09/2023: Hb 12.9 Cr 0.94  10/2021: Chol 180, TG 95, HDL 66, LDL 97  Echocardiogram 10/07/2023: LVEF 60-65%. Grade 1 DD. Mod TR Partial fusion of right/left cusps. The aortic valve is calcified.  There is severe calcifcation of the aortic valve. There is severe  thickening of the aortic valve. Aortic valve regurgitation is moderate.   Moderate to severe aortic valve stenosis.   Zio patch monitor 13 days 10/13/2023 - 10/27/2023: Dominant rhythm: Sinus. HR 52-125 bpm. Avg HR 71 bpm, in sinus rhythm. 62 episodes of SVT/atrial tachycardia, fastest at 185 bpm for 12 beats, longest for 15.9 secs at 117 bpm. <1% isolated SVE,  couplet/triplets. 0 episodes of VT. <1% isolated VE, couplets. No atrial fibrillation/atrial flutter/VT/high grade AV block, sinus pause >3sec noted. 8 patient triggered events, correlated with sinus rhythm.     Physical Exam:   Physical Exam Vitals and nursing note reviewed.  Constitutional:      General: She is not in acute distress. Neck:     Vascular: No JVD.  Cardiovascular:     Rate and Rhythm: Normal rate and regular rhythm.     Heart sounds: Normal heart sounds. No murmur heard. Pulmonary:     Effort: Pulmonary effort is normal.     Breath sounds: Normal breath sounds. No wheezing or rales.  Musculoskeletal:     Right lower leg: No edema.     Left lower leg: No edema.      VISIT DIAGNOSES:   ICD-10-CM   1. Moderate aortic stenosis  I35.0 ECHOCARDIOGRAM COMPLETE    2. Mixed hyperlipidemia  E78.2 Lipid panel    3. Syncope and collapse  R55        ASSESSMENT AND PLAN: .    Kayla Ward is a 78 y.o. female with hyperlipidemia, moderate aortic stenosis and moderate aortic regurgitation, syncope  Syncope: Episode of  syncope in 09/2023.  In the past, patient has had multiple prior episodes of syncope, all of which have had underlying theme of dehydration or vasovagal event.  I suspect her episode in 09/2023 was triggered by her infusion of Reclast, for osteoporosis, which can cause dehydration.  We discussed increasing fluid intake.   PSVT: No reported palpitation symptoms, although short episode of SVT was noted on Zio monitor in 09/2023.  Monitor for now.   Moderate AS/moderate AI: Mild progression.  Repeat echocardiogram in 6 months.  Mixed hyperlipidemia Check lipid  panel.      F/u in 6 months  Signed, Elder Negus, MD

## 2023-11-21 NOTE — Patient Instructions (Signed)
 Lab Work: Lipid Panel   If you have labs (blood work) drawn today and your tests are completely normal, you will receive your results only by: MyChart Message (if you have MyChart) OR A paper copy in the mail If you have any lab test that is abnormal or we need to change your treatment, we will call you to review the results.   Testing/Procedures: ECHO IN 6 MONTHS   Your physician has requested that you have an echocardiogram. Echocardiography is a painless test that uses sound waves to create images of your heart. It provides your doctor with information about the size and shape of your heart and how well your heart's chambers and valves are working. This procedure takes approximately one hour. There are no restrictions for this procedure. Please do NOT wear cologne, perfume, aftershave, or lotions (deodorant is allowed). Please arrive 15 minutes prior to your appointment time.  Please note: We ask at that you not bring children with you during ultrasound (echo/ vascular) testing. Due to room size and safety concerns, children are not allowed in the ultrasound rooms during exams. Our front office staff cannot provide observation of children in our lobby area while testing is being conducted. An adult accompanying a patient to their appointment will only be allowed in the ultrasound room at the discretion of the ultrasound technician under special circumstances. We apologize for any inconvenience.    Follow-Up: At [facility], you and your health needs are our priority.  As part of our continuing mission to provide you with exceptional heart care, we have created designated Provider Care Teams.  These Care Teams include your primary Cardiologist (physician) and Advanced Practice Providers (APPs -  Physician Assistants and Nurse Practitioners) who all work together to provide you with the care you need, when you need it.   Your next appointment:   6 month(s)  Provider:   Elder Negus, MD     Other Instructions   1st Floor: - Lobby - Registration  - Pharmacy  - Lab - Cafe  2nd Floor: - PV Lab - Diagnostic Testing (echo, CT, nuclear med)  3rd Floor: - Vacant  4th Floor: - TCTS (cardiothoracic surgery) - AFib Clinic - Structural Heart Clinic - Vascular Surgery  - Vascular Ultrasound  5th Floor: - HeartCare Cardiology (general and EP) - Clinical Pharmacy for coumadin, hypertension, lipid, weight-loss medications, and med management appointments    Valet parking services will be available as well.

## 2023-11-26 ENCOUNTER — Other Ambulatory Visit: Payer: Self-pay | Admitting: Cardiology

## 2023-11-26 DIAGNOSIS — E782 Mixed hyperlipidemia: Secondary | ICD-10-CM

## 2023-12-30 ENCOUNTER — Encounter: Payer: Self-pay | Admitting: Cardiology

## 2023-12-30 LAB — LIPID PANEL
Chol/HDL Ratio: 2.5 ratio (ref 0.0–4.4)
Cholesterol, Total: 163 mg/dL (ref 100–199)
HDL: 65 mg/dL (ref >39)
LDL Chol Calc (NIH): 80 mg/dL (ref 0–99)
Triglycerides: 101 mg/dL (ref 0–149)
VLDL Cholesterol Cal: 18 mg/dL (ref 5–40)

## 2024-05-14 ENCOUNTER — Ambulatory Visit (HOSPITAL_COMMUNITY)
Admission: RE | Admit: 2024-05-14 | Discharge: 2024-05-14 | Disposition: A | Discharge status: Home / Self Care | Source: Ambulatory Visit | Attending: Cardiology | Admitting: Cardiology

## 2024-05-14 DIAGNOSIS — I350 Nonrheumatic aortic (valve) stenosis: Secondary | ICD-10-CM | POA: Insufficient documentation

## 2024-05-14 LAB — ECHOCARDIOGRAM COMPLETE
AR max vel: 0.68 cm2
AV Area VTI: 0.66 cm2
AV Area mean vel: 0.62 cm2
AV Mean grad: 32 mmHg
AV Peak grad: 51.2 mmHg
Ao pk vel: 3.58 m/s
Area-P 1/2: 3.6 cm2
P 1/2 time: 455 ms
S' Lateral: 2.8 cm

## 2024-05-17 ENCOUNTER — Ambulatory Visit: Payer: Self-pay | Admitting: Cardiology

## 2024-05-17 NOTE — Progress Notes (Signed)
 There appears to be progression of aortic stenosis severity. Recommend in office follow up to discuss the findings and next steps.  Thanks MJP

## 2024-05-25 ENCOUNTER — Encounter: Payer: Self-pay | Admitting: Cardiology

## 2024-05-25 ENCOUNTER — Ambulatory Visit: Attending: Cardiology | Admitting: Cardiology

## 2024-05-25 ENCOUNTER — Encounter: Payer: Self-pay | Admitting: *Deleted

## 2024-05-25 VITALS — BP 128/75 | HR 73 | Resp 16 | Ht 63.0 in | Wt 120.8 lb

## 2024-05-25 DIAGNOSIS — I77810 Thoracic aortic ectasia: Secondary | ICD-10-CM | POA: Diagnosis present

## 2024-05-25 DIAGNOSIS — I351 Nonrheumatic aortic (valve) insufficiency: Secondary | ICD-10-CM | POA: Insufficient documentation

## 2024-05-25 DIAGNOSIS — I350 Nonrheumatic aortic (valve) stenosis: Secondary | ICD-10-CM | POA: Insufficient documentation

## 2024-05-25 NOTE — Progress Notes (Signed)
 Cardiology Office Note:  .   Date:  05/25/2024  ID:  Kayla Ward, DOB 10/06/1945, MRN [id number] PCP: Leila Lucie LABOR, MD  [facility] Providers Cardiologist:  Newman Lawrence, MD PCP: Leila Lucie LABOR, MD  Chief Complaint  Patient presents with   Nonrheumatic aortic valve insufficiency   Follow-up      History of Present Illness: .    Kayla Ward is a 78 y.o. female with hyperlipidemia, aortic stenosis and regurgitation  Patient stays active with regular walking.  Recently, she has noticed exertional dyspnea on walking up and down the hills.  She also reports chest tightness, particularly on walking.  She denies orthopnea, PND, leg edema.  Reviewed recent echocardiogram notes with the patient, details below.  Patient had a syncopal episode in 09/2023 that led to traumatic closed fracture of C2 vertebra with minimal displacement.  This was deemed to be a possible vasovagal syncope episode.  Vitals:   05/25/24 1132  BP: 128/75  Pulse: 73  Resp: 16  SpO2: 99%      ROS:  Review of Systems  Cardiovascular:  Positive for syncope. Negative for chest pain, dyspnea on exertion, leg swelling and palpitations.     Studies Reviewed: SABRA        Labs 09/2023: Hb 12.9 Cr 0.94  10/2021: Chol 180, TG 95, HDL 66, LDL 97  Echocardiogram 04/2024:  1. Left ventricular ejection fraction, by estimation, is 60 to 65%. The  left ventricle has normal function. The left ventricle has no regional  wall motion abnormalities. Left ventricular diastolic parameters were  normal.   2. Right ventricular systolic function is normal. The right ventricular  size is normal.   3. The mitral valve is abnormal. Trivial mitral valve regurgitation. No  evidence of mitral stenosis.   4. Tricuspid valve regurgitation is moderate.   5. Although gradients similar to primary AVA 0.66 cm2 and decreased DVI  0.23 suggests more severe AS Fusion of right and left cusps . The aortic  valve  is tricuspid. There is severe calcifcation of the aortic valve.  There is severe thickening of the aortic valve. Aortic valve regurgitation is moderate. Severe aortic valve stenosis.   6. Aortic dilatation noted. There is mild dilatation of the ascending  aorta, measuring 38 mm.   7. The inferior vena cava is normal in size with greater than 50%  respiratory variability, suggesting right atrial pressure of 3 mmHg.    Zio patch monitor 13 days 10/13/2023 - 10/27/2023: Dominant rhythm: Sinus. HR 52-125 bpm. Avg HR 71 bpm, in sinus rhythm. 62 episodes of SVT/atrial tachycardia, fastest at 185 bpm for 12 beats, longest for 15.9 secs at 117 bpm. <1% isolated SVE,  couplet/triplets. 0 episodes of VT. <1% isolated VE, couplets. No atrial fibrillation/atrial flutter/VT/high grade AV block, sinus pause >3sec noted. 8 patient triggered events, correlated with sinus rhythm.     Physical Exam:   Physical Exam Vitals and nursing note reviewed.  Constitutional:      General: She is not in acute distress. Neck:     Vascular: No JVD.  Cardiovascular:     Rate and Rhythm: Normal rate and regular rhythm.     Heart sounds: Murmur heard.     Harsh midsystolic murmur is present with a grade of 3/6 at the upper right sternal border radiating to the neck.     High-pitched blowing decrescendo early diastolic murmur is present with a grade of 2/4 at the upper right sternal  border radiating to the apex.  Pulmonary:     Effort: Pulmonary effort is normal.     Breath sounds: Normal breath sounds. No wheezing or rales.  Musculoskeletal:     Right lower leg: No edema.     Left lower leg: No edema.      VISIT DIAGNOSES:   ICD-10-CM   1. Nonrheumatic aortic valve insufficiency  I35.1 Ambulatory referral to Cardiothoracic Surgery    2. Moderate aortic stenosis  I35.0 CBC    Basic metabolic panel with GFR    3. Ascending aorta dilatation (HCC)  I77.810 CT ANGIO CHEST AORTA W/CM & OR WO/CM         ASSESSMENT AND PLAN: .    Kayla Ward is a 78 y.o. female with hyperlipidemia, aortic stenosis and aortic regurgitation, borderline dilated ascending aorta.  Aortic stenosis/regurgitation: Progressive worsening of aortic valve calcification, now with severe AS and moderate aortic regurgitation based on aortic valve area and dimensionless index. Patient is not symptomatic with exertional dyspnea symptoms. She had a syncope event in 09/2023, although it was attributed to dehydration or vasovagal etiology. Regardless, I do think she is now stage D severe aortic stenosis/regurgitation, with borderline dilation of ascending aorta at 3.8 cm. I will obtain CT angiogram of aorta to get better visualization of the aorta. Recommend right heart catheterization and coronary angiography, may not need direct LVEDP measurement given clear diagnosis severe aortic stenosis, filling pressures can be obtained with right heart catheterization.  Refer to CVTS for consideration for aortic valve surgery.  If she is considered a candidate for TAVR, she can then be seen in TAVR clinic. Check CBC, BMP today. Continue Crestor  10 mg daily.   Informed Consent   Shared Decision Making/Informed Consent The risks [stroke (1 in 1000), death (1 in 1000), kidney failure [usually temporary] (1 in 500), bleeding (1 in 200), allergic reaction [possibly serious] (1 in 200)], benefits (diagnostic support and management of coronary artery disease) and alternatives of a cardiac catheterization were discussed in detail with Kayla Ward and she is willing to proceed.      F/u in 6 months  Signed, Newman JINNY Lawrence, MD

## 2024-05-25 NOTE — Patient Instructions (Signed)
 Lab Work: CBC  BMP If you have labs (blood work) drawn today and your tests are completely normal, you will receive your results only by: MyChart Message (if you have MyChart) OR A paper copy in the mail If you have any lab test that is abnormal or we need to change your treatment, we will call you to review the results.  Testing/Procedures: CTA Aorta  Non-Cardiac CT Angiography (CTA), is a special type of CT scan that uses a computer to produce multi-dimensional views of major blood vessels throughout the body. In CT angiography, a contrast material is injected through an IV to help visualize the blood vessels    Referral to cardiothoracic surgery (APPOINTMENT TO BE SCHEDULED AFTER HEART CATH AND CTA)   Left and Right Heart Catheterization    Follow-Up: At [facility], you and your health needs are our priority.  As part of our continuing mission to provide you with exceptional heart care, our providers are all part of one team.  This team includes your primary Cardiologist (physician) and Advanced Practice Providers or APPs (Physician Assistants and Nurse Practitioners) who all work together to provide you with the care you need, when you need it.  Your next appointment:   AFTER HEART CATH WITH APP   Then   6 Months with Dr. Elmira    We recommend signing up for the patient portal called MyChart.  Sign up information is provided on this After Visit Summary.  MyChart is used to connect with patients for Virtual Visits (Telemedicine).  Patients are able to view lab/test results, encounter notes, upcoming appointments, etc.  Non-urgent messages can be sent to your provider as well.   To learn more about what you can do with MyChart, go to [email or web address].   Other Instructions

## 2024-05-26 LAB — BASIC METABOLIC PANEL WITH GFR
BUN/Creatinine Ratio: 16 (ref 12–28)
BUN: 14 mg/dL (ref 8–27)
CO2: 25 mmol/L (ref 20–29)
Calcium: 9.9 mg/dL (ref 8.7–10.3)
Chloride: 104 mmol/L (ref 96–106)
Creatinine, Ser: 0.86 mg/dL (ref 0.57–1.00)
Glucose: 84 mg/dL (ref 70–99)
Potassium: 4.2 mmol/L (ref 3.5–5.2)
Sodium: 142 mmol/L (ref 134–144)
eGFR: 69 mL/min/1.73 (ref >59)

## 2024-05-26 LAB — CBC
Hematocrit: 38.7 % (ref 34.0–46.6)
Hemoglobin: 12.7 g/dL (ref 11.1–15.9)
MCH: 32 pg (ref 26.6–33.0)
MCHC: 32.8 g/dL (ref 31.5–35.7)
MCV: 98 fL — ABNORMAL HIGH (ref 79–97)
Platelets: 193 x10E3/uL (ref 150–450)
RBC: 3.97 x10E6/uL (ref 3.77–5.28)
RDW: 12.3 % (ref 11.7–15.4)
WBC: 5 x10E3/uL (ref 3.4–10.8)

## 2024-05-31 ENCOUNTER — Ambulatory Visit (HOSPITAL_COMMUNITY)
Admission: RE | Admit: 2024-05-31 | Discharge: 2024-05-31 | Disposition: A | Discharge status: Home / Self Care | Source: Ambulatory Visit | Attending: Cardiology | Admitting: Cardiology

## 2024-05-31 DIAGNOSIS — I77810 Thoracic aortic ectasia: Secondary | ICD-10-CM | POA: Insufficient documentation

## 2024-05-31 MED ORDER — IOHEXOL 350 MG/ML SOLN: 100.0000 mL | Freq: Once | INTRAVENOUS | Status: DC | PRN

## 2024-05-31 MED ORDER — IOHEXOL 350 MG/ML SOLN
75.0000 mL | Freq: Once | INTRAVENOUS | Status: AC | PRN
  Administered 2024-05-31: 75 mL via INTRAVENOUS

## 2024-06-04 ENCOUNTER — Telehealth: Payer: Self-pay | Admitting: *Deleted

## 2024-06-04 NOTE — Telephone Encounter (Signed)
 Cardiac Catheterization scheduled at [facility] for: Tuesday June 05, 2024 10:30 AM Arrival time [facility] Main Entrance A at: 8:30 AM  Diet: -Nothing to eat after midnight.  Hydration: -May drink clear liquids until 2 hours before the procedure.  Approved liquids: Water , clear tea, black coffee, fruit juices-non-citric and without pulp,Gatorade, plain Jello/popsicles.   -Please drink 16 oz of water  2 hours before procedure.  Medication instructions: -Usual morning medications can be taken including aspirin  81 mg.  Plan to go home the same day, you will only stay overnight if medically necessary.  You must have responsible adult to drive you home.  Someone must be with you the first 24 hours after you arrive home.  Reviewed procedure instructions with patient.

## 2024-06-05 ENCOUNTER — Ambulatory Visit (HOSPITAL_COMMUNITY)
Admission: RE | Admit: 2024-06-05 | Discharge: 2024-06-05 | Disposition: A | Discharge status: Home / Self Care | Attending: Cardiology | Admitting: Cardiology

## 2024-06-05 ENCOUNTER — Other Ambulatory Visit: Payer: Self-pay

## 2024-06-05 ENCOUNTER — Encounter (HOSPITAL_COMMUNITY)
Admission: RE | Disposition: A | Discharge status: Home / Self Care | Payer: Self-pay | Source: Home / Self Care | Attending: Cardiology

## 2024-06-05 DIAGNOSIS — E785 Hyperlipidemia, unspecified: Secondary | ICD-10-CM | POA: Diagnosis not present

## 2024-06-05 DIAGNOSIS — I352 Nonrheumatic aortic (valve) stenosis with insufficiency: Secondary | ICD-10-CM | POA: Diagnosis present

## 2024-06-05 DIAGNOSIS — I272 Pulmonary hypertension, unspecified: Secondary | ICD-10-CM | POA: Insufficient documentation

## 2024-06-05 DIAGNOSIS — Z79899 Other long term (current) drug therapy: Secondary | ICD-10-CM | POA: Insufficient documentation

## 2024-06-05 DIAGNOSIS — I351 Nonrheumatic aortic (valve) insufficiency: Secondary | ICD-10-CM | POA: Diagnosis not present

## 2024-06-05 DIAGNOSIS — I77810 Thoracic aortic ectasia: Secondary | ICD-10-CM | POA: Insufficient documentation

## 2024-06-05 HISTORY — PX: RIGHT/LEFT HEART CATH AND CORONARY ANGIOGRAPHY: CATH118266

## 2024-06-05 LAB — POCT I-STAT EG7
Acid-Base Excess: 0 mmol/L (ref 0.0–2.0)
Acid-base deficit: 1 mmol/L (ref 0.0–2.0)
Bicarbonate: 26.5 mmol/L (ref 20.0–28.0)
Bicarbonate: 24.7 mmol/L (ref 20.0–28.0)
Calcium, Ion: 1.19 mmol/L (ref 1.15–1.40)
Calcium, Ion: 1.17 mmol/L (ref 1.15–1.40)
HCT: 34 % — ABNORMAL LOW (ref 36.0–46.0)
HCT: 33 % — ABNORMAL LOW (ref 36.0–46.0)
Hemoglobin: 11.6 g/dL — ABNORMAL LOW (ref 12.0–15.0)
Hemoglobin: 11.2 g/dL — ABNORMAL LOW (ref 12.0–15.0)
O2 Saturation: 67 %
O2 Saturation: 94 %
Potassium: 3.6 mmol/L (ref 3.5–5.1)
Potassium: 3.6 mmol/L (ref 3.5–5.1)
Sodium: 142 mmol/L (ref 135–145)
Sodium: 142 mmol/L (ref 135–145)
TCO2: 28 mmol/L (ref 22–32)
TCO2: 26 mmol/L (ref 22–32)
pCO2, Ven: 48.1 mmHg (ref 44–60)
pCO2, Ven: 43.2 mmHg — ABNORMAL LOW (ref 44–60)
pH, Ven: 7.349 (ref 7.25–7.43)
pH, Ven: 7.365 (ref 7.25–7.43)
pO2, Ven: 37 mmHg (ref 32–45)
pO2, Ven: 72 mmHg — ABNORMAL HIGH (ref 32–45)

## 2024-06-05 SURGERY — RIGHT/LEFT HEART CATH AND CORONARY ANGIOGRAPHY
Anesthesia: LOCAL

## 2024-06-05 MED ORDER — SODIUM CHLORIDE 0.9% FLUSH: 3.0000 mL | Freq: Two times a day (BID) | INTRAVENOUS | Status: DC

## 2024-06-05 MED ORDER — FENTANYL CITRATE (PF) 100 MCG/2ML IJ SOLN
INTRAMUSCULAR | Status: DC | PRN
  Administered 2024-06-05: 25 ug via INTRAVENOUS

## 2024-06-05 MED ORDER — HEPARIN SODIUM (PORCINE) 1000 UNIT/ML IJ SOLN
INTRAMUSCULAR | Status: AC
  Filled 2024-06-05: qty 10

## 2024-06-05 MED ORDER — IOHEXOL 350 MG/ML SOLN
INTRAVENOUS | Status: DC | PRN
  Administered 2024-06-05: 25 mL

## 2024-06-05 MED ORDER — SODIUM CHLORIDE 0.9 % IV SOLN: 250.0000 mL | INTRAVENOUS | Status: DC | PRN

## 2024-06-05 MED ORDER — MIDAZOLAM HCL 2 MG/2ML IJ SOLN
INTRAMUSCULAR | Status: DC | PRN
  Administered 2024-06-05: 1 mg via INTRAVENOUS

## 2024-06-05 MED ORDER — VERAPAMIL HCL 2.5 MG/ML IV SOLN
INTRAVENOUS | Status: AC
  Filled 2024-06-05: qty 2

## 2024-06-05 MED ORDER — FENTANYL CITRATE (PF) 100 MCG/2ML IJ SOLN
INTRAMUSCULAR | Status: AC
  Filled 2024-06-05: qty 2

## 2024-06-05 MED ORDER — LABETALOL HCL 5 MG/ML IV SOLN: 10.0000 mg | INTRAVENOUS | Status: DC | PRN

## 2024-06-05 MED ORDER — LIDOCAINE HCL (PF) 1 % IJ SOLN
INTRAMUSCULAR | Status: DC | PRN
  Administered 2024-06-05 (×2): 2 mL via INTRADERMAL

## 2024-06-05 MED ORDER — HEPARIN SODIUM (PORCINE) 1000 UNIT/ML IJ SOLN
INTRAMUSCULAR | Status: DC | PRN
  Administered 2024-06-05: 2500 [IU] via INTRAVENOUS

## 2024-06-05 MED ORDER — HYDRALAZINE HCL 20 MG/ML IJ SOLN: 10.0000 mg | INTRAMUSCULAR | Status: DC | PRN

## 2024-06-05 MED ORDER — FREE WATER
500.0000 mL | Freq: Once | Status: DC
Start: 2024-06-05 — End: 2024-06-05

## 2024-06-05 MED ORDER — SODIUM CHLORIDE 0.9% FLUSH: 3.0000 mL | INTRAVENOUS | Status: DC | PRN

## 2024-06-05 MED ORDER — LIDOCAINE HCL (PF) 1 % IJ SOLN
INTRAMUSCULAR | Status: AC
  Filled 2024-06-05: qty 30

## 2024-06-05 MED ORDER — HEPARIN (PORCINE) IN NACL 1000-0.9 UT/500ML-% IV SOLN
INTRAVENOUS | Status: DC | PRN
  Administered 2024-06-05 (×2): 500 mL

## 2024-06-05 MED ORDER — ONDANSETRON HCL 4 MG/2ML IJ SOLN: 4.0000 mg | Freq: Four times a day (QID) | INTRAMUSCULAR | Status: DC | PRN

## 2024-06-05 MED ORDER — MIDAZOLAM HCL 2 MG/2ML IJ SOLN
INTRAMUSCULAR | Status: AC
  Filled 2024-06-05: qty 2

## 2024-06-05 MED ORDER — VERAPAMIL HCL 2.5 MG/ML IV SOLN
INTRAVENOUS | Status: DC | PRN
  Administered 2024-06-05: 10 mL via INTRA_ARTERIAL

## 2024-06-05 MED ORDER — FREE WATER: 500.0000 mL | Freq: Once | Status: DC

## 2024-06-05 MED ORDER — ASPIRIN 81 MG PO CHEW
81.0000 mg | CHEWABLE_TABLET | ORAL | Status: DC
Start: 2024-06-05 — End: 2024-06-05

## 2024-06-05 MED ORDER — ACETAMINOPHEN 325 MG PO TABS: 650.0000 mg | ORAL_TABLET | ORAL | Status: DC | PRN

## 2024-06-05 SURGICAL SUPPLY — 8 items
CATH BALLN WEDGE 5F 110CM (CATHETERS) IMPLANT
CATH INFINITI AMBI 5FR TG (CATHETERS) IMPLANT
CATH INFINITI JR4 5F (CATHETERS) IMPLANT
GLIDESHEATH SLEND A-KIT 6F 22G (SHEATH) IMPLANT
GUIDEWIRE INQWIRE 1.5J.035X260 (WIRE) IMPLANT
PACK CARDIAC CATHETERIZATION (CUSTOM PROCEDURE TRAY) ×1 IMPLANT
SET ATX-X65L (MISCELLANEOUS) IMPLANT
SHEATH GLIDE SLENDER 4/5FR (SHEATH) IMPLANT

## 2024-06-05 NOTE — Progress Notes (Signed)
 Patient and husband was given discharge instructions. Both verbalized understanding.

## 2024-06-05 NOTE — Interval H&P Note (Signed)
 History and Physical Interval Note:  06/05/2024 10:35 AM  Kayla Ward  has presented today for surgery, with the diagnosis of aortic stenosis.  The various methods of treatment have been discussed with the patient and family. After consideration of risks, benefits and other options for treatment, the patient has consented to  Procedure(s): RIGHT/LEFT HEART CATH AND CORONARY ANGIOGRAPHY (N/A) as a surgical intervention.  The patient's history has been reviewed, patient examined, no change in status, stable for surgery.  I have reviewed the patient's chart and labs.  Questions were answered to the patient's satisfaction.     Tarry Fountain J Tayen Narang

## 2024-06-05 NOTE — H&P (Addendum)
 OV 05/25/2024 copied for documentation   Cardiology Office Note:  .   Date:  06/05/2024  ID:  Kayla Ward, DOB Oct 23, 1945, MRN [id number] PCP: Leila Lucie LABOR, MD  [facility] Providers Cardiologist:  Newman Lawrence, MD PCP: Leila Lucie LABOR, MD  C/C: AS/AI   History of Present Illness: .    Kayla Ward is a 78 y.o. female with hyperlipidemia, aortic stenosis and regurgitation  Patient stays active with regular walking.  Recently, she has noticed exertional dyspnea on walking up and down the hills.  She also reports chest tightness, particularly on walking.  She denies orthopnea, PND, leg edema.  Reviewed recent echocardiogram notes with the patient, details below.  Patient had a syncopal episode in 09/2023 that led to traumatic closed fracture of C2 vertebra with minimal displacement.  This was deemed to be a possible vasovagal syncope episode.  Vitals:   06/05/24 0912  BP: 134/61  Pulse: 71  Resp: 17  Temp: 98.1 F (36.7 C)  SpO2: 100%      ROS:  Review of Systems  Cardiovascular:  Positive for syncope. Negative for chest pain, dyspnea on exertion, leg swelling and palpitations.     Studies Reviewed: Kayla Ward        Labs 09/2023: Hb 12.9 Cr 0.94  10/2021: Chol 180, TG 95, HDL 66, LDL 97  Echocardiogram 04/2024:  1. Left ventricular ejection fraction, by estimation, is 60 to 65%. The  left ventricle has normal function. The left ventricle has no regional  wall motion abnormalities. Left ventricular diastolic parameters were  normal.   2. Right ventricular systolic function is normal. The right ventricular  size is normal.   3. The mitral valve is abnormal. Trivial mitral valve regurgitation. No  evidence of mitral stenosis.   4. Tricuspid valve regurgitation is moderate.   5. Although gradients similar to primary AVA 0.66 cm2 and decreased DVI  0.23 suggests more severe AS Fusion of right and left cusps . The aortic  valve is tricuspid. There  is severe calcifcation of the aortic valve.  There is severe thickening of the aortic valve. Aortic valve regurgitation is moderate. Severe aortic valve stenosis.   6. Aortic dilatation noted. There is mild dilatation of the ascending  aorta, measuring 38 mm.   7. The inferior vena cava is normal in size with greater than 50%  respiratory variability, suggesting right atrial pressure of 3 mmHg.    Zio patch monitor 13 days 10/13/2023 - 10/27/2023: Dominant rhythm: Sinus. HR 52-125 bpm. Avg HR 71 bpm, in sinus rhythm. 62 episodes of SVT/atrial tachycardia, fastest at 185 bpm for 12 beats, longest for 15.9 secs at 117 bpm. <1% isolated SVE,  couplet/triplets. 0 episodes of VT. <1% isolated VE, couplets. No atrial fibrillation/atrial flutter/VT/high grade AV block, sinus pause >3sec noted. 8 patient triggered events, correlated with sinus rhythm.     Physical Exam:   Physical Exam Vitals and nursing note reviewed.  Constitutional:      General: She is not in acute distress. Neck:     Vascular: No JVD.  Cardiovascular:     Rate and Rhythm: Normal rate and regular rhythm.     Heart sounds: Murmur heard.     Harsh midsystolic murmur is present with a grade of 3/6 at the upper right sternal border radiating to the neck.     High-pitched blowing decrescendo early diastolic murmur is present with a grade of 2/4 at the upper right sternal border radiating to  the apex.  Pulmonary:     Effort: Pulmonary effort is normal.     Breath sounds: Normal breath sounds. No wheezing or rales.  Musculoskeletal:     Right lower leg: No edema.     Left lower leg: No edema.      VISIT DIAGNOSES: No diagnosis found.     ASSESSMENT AND PLAN: .    Kayla Ward is a 78 y.o. female with hyperlipidemia, aortic stenosis and aortic regurgitation, borderline dilated ascending aorta.  Aortic stenosis/regurgitation: Progressive worsening of aortic valve calcification, now with severe AS and moderate  aortic regurgitation based on aortic valve area and dimensionless index. Patient is not symptomatic with exertional dyspnea symptoms. She had a syncope event in 09/2023, although it was attributed to dehydration or vasovagal etiology. Regardless, I do think she is now stage D severe aortic stenosis/regurgitation, with borderline dilation of ascending aorta at 3.8 cm. I will obtain CT angiogram of aorta to get better visualization of the aorta. Recommend right heart catheterization and coronary angiography, may not need direct LVEDP measurement given clear diagnosis severe aortic stenosis, filling pressures can be obtained with right heart catheterization.  Refer to CVTS for consideration for aortic valve surgery.  If she is considered a candidate for TAVR, she can then be seen in TAVR clinic. Check CBC, BMP today. Continue Crestor  10 mg daily.   Informed Consent   Shared Decision Making/Informed Consent The risks [stroke (1 in 1000), death (1 in 1000), kidney failure [usually temporary] (1 in 500), bleeding (1 in 200), allergic reaction [possibly serious] (1 in 200)], benefits (diagnostic support and management of coronary artery disease) and alternatives of a cardiac catheterization were discussed in detail with Ms. Lesh and she is willing to proceed.      F/u in 6 months  Signed, Newman JINNY Lawrence, MD

## 2024-06-05 NOTE — Discharge Instructions (Signed)

## 2024-06-06 ENCOUNTER — Encounter (HOSPITAL_COMMUNITY): Payer: Self-pay | Admitting: Cardiology

## 2024-06-06 ENCOUNTER — Ambulatory Visit: Payer: Self-pay | Admitting: Cardiology

## 2024-06-07 NOTE — Progress Notes (Unsigned)
 Cardiology Office Note    Patient Name: Kayla Ward Date of Encounter: 06/07/2024  Primary Care Provider:  Leila Lucie LABOR, MD Primary Cardiologist:  Newman JINNY Lawrence, MD Primary Electrophysiologist: None   Past Medical History    Past Medical History:  Diagnosis Date   Aortic regurgitation    AS (aortic stenosis)    Dysrhythmia    Headache    Hyperlipidemia    Seasonal allergies     History of Present Illness  Kayla Ward is a 78 y.o. female with a PMH of aortic stenosis, HLD, syncope who presents today for post heart cath follow-up.  [name] has been followed by Dr. Lawrence since 2020 for management of aortic insufficiency.  She completed a 2D echo in 10/2018 that showed moderate aortic regurg with moderate AS.  She continued to have surveillance echocardiograms and was admitted on 09/2023 with syncope.  She suffered a fracture of her cervical spine.  She had a TTE completed that showed progression of aortic stenosis to moderate/severe with thought of syncopal episode being related to vasovagal syncope.  She wore a 14-day Zio patch that showed an episode of SVT with no significant arrhythmia and predominantly sinus rhythm.  She was seen most recently on 05/25/2024 for follow-up and endorsed chest tightness with ambulation.  She had a R/LHC completed that showed normal coronaries PHT and also completed a CT angio of the chest for evaluation of aortic dilation that showed stable thoracic aorta of 3.8 cm.   Patient denies chest pain, palpitations, dyspnea, PND, orthopnea, nausea, vomiting, dizziness, syncope, edema, weight gain, or early satiety.   Discussed the use of AI scribe software for clinical note transcription with the patient, who gave verbal consent to proceed.  History of Present Illness    ***Notes:   Review of Systems  Please see the history of present illness.    All other systems reviewed and are otherwise negative except as noted  above.  Physical Exam    Wt Readings from Last 3 Encounters:  06/05/24 116 lb (52.6 kg)  05/25/24 120 lb 12.8 oz (54.8 kg)  11/21/23 119 lb 6.4 oz (54.2 kg)   CD:Uyzmz were no vitals filed for this visit.,There is no height or weight on file to calculate BMI. GEN: Well nourished, well developed in no acute distress Neck: No JVD; No carotid bruits Pulmonary: Clear to auscultation without rales, wheezing or rhonchi  Cardiovascular: Normal rate. Regular rhythm. Normal S1. Normal S2.   Murmurs: There is no murmur.  ABDOMEN: Soft, non-tender, non-distended EXTREMITIES:  No edema; No deformity   EKG/LABS/ Recent Cardiac Studies   ECG personally reviewed by me today - ***  Risk Assessment/Calculations:   {Does this patient have ATRIAL FIBRILLATION?[phone]}      Lab Results  Component Value Date   WBC 5.0 05/25/2024   HGB 11.6 (L) 06/05/2024   HGB 11.2 (L) 06/05/2024   HCT 34.0 (L) 06/05/2024   HCT 33.0 (L) 06/05/2024   MCV 98 (H) 05/25/2024   PLT 193 05/25/2024   Lab Results  Component Value Date   CREATININE 0.86 05/25/2024   BUN 14 05/25/2024   NA 142 06/05/2024   NA 142 06/05/2024   K 3.6 06/05/2024   K 3.6 06/05/2024   CL 104 05/25/2024   CO2 25 05/25/2024   Lab Results  Component Value Date   CHOL 163 12/29/2023   HDL 65 12/29/2023   LDLCALC 80 12/29/2023   TRIG 101 12/29/2023   CHOLHDL  2.5 12/29/2023    No results found for: HGBA1C Assessment & Plan    Assessment and Plan Assessment & Plan     1.  Moderate/severe AS  2.  Chest pain  3.  Ascending aortic aneurysm  4.  Hyperlipidemia      Disposition: Follow-up with Newman JINNY Lawrence, MD or APP in *** months {Are you ordering a CV Procedure (e.g. stress test, cath, DCCV, TEE, etc)?   Press F2        :[id number]}   Signed, Wyn Raddle, Jackee Shove, NP 06/07/2024, 1:31 PM [facility]

## 2024-06-08 ENCOUNTER — Ambulatory Visit: Attending: Nurse Practitioner | Admitting: Nurse Practitioner

## 2024-06-08 ENCOUNTER — Encounter: Payer: Self-pay | Admitting: Nurse Practitioner

## 2024-06-08 VITALS — BP 100/56 | HR 75 | Ht 63.0 in | Wt 119.0 lb

## 2024-06-08 DIAGNOSIS — E782 Mixed hyperlipidemia: Secondary | ICD-10-CM | POA: Diagnosis present

## 2024-06-08 DIAGNOSIS — I351 Nonrheumatic aortic (valve) insufficiency: Secondary | ICD-10-CM | POA: Insufficient documentation

## 2024-06-08 DIAGNOSIS — I77810 Thoracic aortic ectasia: Secondary | ICD-10-CM | POA: Insufficient documentation

## 2024-06-08 DIAGNOSIS — R079 Chest pain, unspecified: Secondary | ICD-10-CM | POA: Diagnosis present

## 2024-06-08 DIAGNOSIS — I272 Pulmonary hypertension, unspecified: Secondary | ICD-10-CM | POA: Diagnosis present

## 2024-06-08 NOTE — Patient Instructions (Signed)
 Medication Instructions:  Your physician recommends that you continue on your current medications as directed. Please refer to the Current Medication list given to you today.  *If you need a refill on your cardiac medications before your next appointment, please call your pharmacy*  Lab Work: None ordered  If you have labs (blood work) drawn today and your tests are completely normal, you will receive your results only by: MyChart Message (if you have MyChart) OR A paper copy in the mail If you have any lab test that is abnormal or we need to change your treatment, we will call you to review the results.  Testing/Procedures: None ordered  Follow-Up: At [facility], you and your health needs are our priority.  As part of our continuing mission to provide you with exceptional heart care, our providers are all part of one team.  This team includes your primary Cardiologist (physician) and Advanced Practice Providers or APPs (Physician Assistants and Nurse Practitioners) who all work together to provide you with the care you need, when you need it.  Your next appointment:   6 month(s)  Provider:   Newman JINNY Lawrence, MD    We recommend signing up for the patient portal called MyChart.  Sign up information is provided on this After Visit Summary.  MyChart is used to connect with patients for Virtual Visits (Telemedicine).  Patients are able to view lab/test results, encounter notes, upcoming appointments, etc.  Non-urgent messages can be sent to your provider as well.   To learn more about what you can do with MyChart, go to [email or web address].   Other Instructions

## 2024-06-14 ENCOUNTER — Ambulatory Visit

## 2024-06-14 VITALS — BP 119/73 | HR 74 | Resp 20 | Ht 63.0 in | Wt 119.0 lb

## 2024-06-14 DIAGNOSIS — I351 Nonrheumatic aortic (valve) insufficiency: Secondary | ICD-10-CM | POA: Insufficient documentation

## 2024-06-14 NOTE — Progress Notes (Signed)
 [facility]   MULTIDISCIPLINARY HEART VALVE CLINIC    Kayla Ward [facility] Medical Record #[id number] Date of Birth: Jan 09, 1946  Referring: [name] PARAS, MD Primary Care: Anita Bernardino BROCKS, FNP Primary Cardiologist:Manish PARAS Elmira, MD  Chief Complaint:    Chief Complaint  Patient presents with   Aortic Insuffiency    Surgical consult/ ECHO 05/14/24/ Cardiac Cath 06/05/24    History of Present Illness:     Kayla Ward is a 78 y.o. 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  She notes worsening shortness of breath with exertion and fatigue over the last couple years.  She also reports chest tightness with exertion such as walking uphill.  She also has palpitations and had a vasovagal syncopal episode in January in which she hit her head and fractured her neck.  Other than that, she has had no other syncopal issues.  She is functional and independent in her ADLs.  She does light housework and used to garden but does not anymore secondary to osteoarthritis.  She is a non-smoker, does not drink alcohol, or use illicit drugs.  On TTE, the aortic valve is tricuspid. There is severe calcifcation of the aortic valve.  There is severe thickening of the aortic valve. Aortic valve regurgitation is moderate. Severe aortic valve stenosis with Vmax 357, MG 32, AVA 0.66.  LHC demonstrated normal coronaries and RHC showed slightly elevated left sided pressures (PCW 17 mmHg) with preserved index of 2.8.  My measurements: Annulus: 23.3 x 17.0 mm, diameter 19.9, area 312, peri 62.8 LVOT: 24.2 x 15.9 mm, Dia 19.5, area 300, perimeter 64.5 mm RCA: 18.8 mm, LCA: 16.0 mm SOV 29.8-32.0 Ascending: 3.7 x 3.8 cm  Past Medical History:  Diagnosis Date   Aortic regurgitation    AS (aortic stenosis)    Dysrhythmia    Headache    Hyperlipidemia    Seasonal allergies     Past Surgical History:   Procedure Laterality Date   ESOPHAGOGASTRODUODENOSCOPY N/A 07/17/2021   Procedure: ESOPHAGOGASTRODUODENOSCOPY (EGD);  Surgeon: Rollin Dover, MD;  Location: [facility];  Service: Endoscopy;  Laterality: N/A;   RIGHT/LEFT HEART CATH AND CORONARY ANGIOGRAPHY N/A 11/10/2021   Procedure: RIGHT/LEFT HEART CATH AND CORONARY ANGIOGRAPHY;  Surgeon: [name] PARAS, MD;  Location: [facility];  Service: Cardiovascular;  Laterality: N/A;   RIGHT/LEFT HEART CATH AND CORONARY ANGIOGRAPHY N/A 06/05/2024   Procedure: RIGHT/LEFT HEART CATH AND CORONARY ANGIOGRAPHY;  Surgeon: [name] PARAS, MD;  Location: [facility]VAS[facility] CV [facility];  Service: Cardiovascular;  Laterality: N/A;   TEE WITHOUT CARDIOVERSION N/A 11/10/2021   Procedure: TRANSESOPHAGEAL ECHOCARDIOGRAM (TEE);  Surgeon: Ladona Heinz, MD;  Location: [facility] ENDOSCOPY;  Service: Cardiovascular;  Laterality: N/A;   UPPER ESOPHAGEAL ENDOSCOPIC ULTRASOUND (EUS) N/A 07/17/2021   Procedure: UPPER ESOPHAGEAL ENDOSCOPIC ULTRASOUND (EUS);  Surgeon: Rollin Dover, MD;  Location: [facility];  Service: Endoscopy;  Laterality: N/A;    Social History:  Social History   Tobacco Use  Smoking Status Never  Smokeless Tobacco Never    Social History   Substance and Sexual Activity  Alcohol Use Yes   Comment: wine occ     Allergies  Allergen Reactions   Bactrim [Sulfamethoxazole-Trimethoprim] Hives, Nausea And Vomiting and Other (See Comments)    Syncope    Sulfa Antibiotics Hives   Latex Rash      Current Outpatient Medications  Medication Sig Dispense Refill  acetaminophen  (TYLENOL ) 500 MG tablet Take 1,000 mg by mouth daily as needed for pain or headache.     Artificial Saliva (ACT DRY MOUTH) LOZG Use as directed 1 lozenge in the mouth or throat 2 (two) times daily as needed (dry mouth).     ascorbic acid  (VITAMIN C ) 500 MG tablet Take 1,000 mg by mouth daily.     BLACK ELDERBERRY PO Take 50 mg by mouth daily. Chewable     Calcium -Vitamin  D-Vitamin K  650-12.5-40 MG-MCG-MCG CHEW Chew 1 tablet by mouth 2 (two) times daily. Viactiv     carboxymethylcellulose (REFRESH PLUS) 0.5 % SOLN Place 1 drop into both eyes daily as needed (dry eyes).     cetirizine (ZYRTEC) 10 MG tablet Take 10 mg by mouth daily as needed for allergies.     diclofenac  sodium (VOLTAREN ) 1 % GEL Apply 2 g topically daily as needed (right knee pain).     famotidine -calcium  carbonate-magnesium hydroxide (PEPCID  COMPLETE) 10-800-165 MG chewable tablet Chew 1 tablet by mouth daily as needed (heartburn, indigestion).     Multiple Vitamins-Minerals (WOMENS MULTIVITAMIN GUMMIES) CHEW Chew 1 tablet by mouth daily. Alive     rosuvastatin  (CRESTOR ) 10 MG tablet TAKE 1 TABLET BY MOUTH DAILY 90 tablet 3   sodium chloride  (OCEAN) 0.65 % SOLN nasal spray Place 1 spray into both nostrils in the morning.     SUMAtriptan (IMITREX) 50 MG tablet Take 50 mg by mouth daily as needed for migraine.     No current facility-administered medications for this visit.    (Not in a hospital admission)   Family History  Problem Relation Age of Onset   Cancer Mother    Thyroid disease Mother    Cancer Father    Heart disease Father    Heart murmur Sister    Kidney disease Son      Review of Systems:   Review of Systems  Constitutional:  Positive for malaise/fatigue. Negative for chills, fever and weight loss.  Respiratory:  Negative for cough and hemoptysis.   Cardiovascular:  Positive for chest pain and palpitations. Negative for leg swelling.  Musculoskeletal:  Positive for joint pain.  Neurological:  Negative for dizziness and headaches.      Physical Exam: BP 119/73 (BP Location: Left Arm, Patient Position: Sitting, Cuff Size: Normal)   Pulse 74   Resp 20   Ht 5' 3 (1.6 m)   Wt 119 lb (54 kg)   SpO2 97% Comment: RA  BMI 21.08 kg/m  Physical Exam Constitutional:      Appearance: Normal appearance. She is normal weight.  HENT:     Head: Normocephalic and  atraumatic.  Cardiovascular:     Rate and Rhythm: Normal rate and regular rhythm.     Heart sounds: Murmur heard.  Pulmonary:     Effort: Pulmonary effort is normal.     Breath sounds: Normal breath sounds.  Abdominal:     General: There is no distension.     Palpations: Abdomen is soft.  Musculoskeletal:     Right lower leg: No edema.     Left lower leg: No edema.  Neurological:     General: No focal deficit present.     Mental Status: She is alert and oriented to person, place, and time.       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  Newman JINNY Lawrence, MD  Findings Coronary Findings Diagnostic  Dominance: Right  Left Main Vessel is normal in caliber. Vessel is angiographically normal.  Left Anterior Descending Vessel is normal in caliber. Vessel is angiographically normal.  Left Circumflex Vessel is normal in caliber. Vessel is angiographically normal.  Right Coronary Artery Vessel is normal in caliber. Vessel is angiographically normal.  Intervention  No interventions have been documented.   CARDIAC CATHETERIZATION  CARDIAC CATHETERIZATION 11/10/2021  Conclusion Images from the original result were not included. Normal coronary arteries Normal filling pressures  Consider noncardiac etiology for exertional dyspnea   Newman JINNY Lawrence,  MD Pager: [phone] Office: [phone]  Findings Coronary Findings Diagnostic  Dominance: Right  Left Main Vessel is normal in caliber. Vessel is angiographically normal.  Left Anterior Descending Vessel is normal in caliber. Vessel is angiographically normal.  Left Circumflex Vessel is normal in caliber. Vessel is angiographically normal.  Right Coronary Artery Vessel is normal in caliber. Vessel is angiographically normal.  Intervention  No interventions have been documented.     ECHOCARDIOGRAM  ECHOCARDIOGRAM COMPLETE 05/14/2024  Narrative ECHOCARDIOGRAM REPORT    Patient Name:   Kayla Ward Date of Exam: 05/14/2024 Medical Rec #:  [id number]        Height:       63.0 in Accession #:    [id number]       Weight:       119.4 lb Date of Birth:  1946/07/14        BSA:          1.553 m Patient Age:    58 years         BP:           114/62 mmHg Patient Gender: F                HR:           64 bpm. Exam Location:  [address]  Procedure: 2D Echo, Cardiac Doppler and Color Doppler (Both Spectral and Color Flow Doppler were utilized during procedure).  Indications:    I35.0 Aortic Stenosis  History:        Patient has prior history of Echocardiogram examinations, most recent 10/07/2023. Risk Factors:Dyslipidemia.  Sonographer:    Carl Coma RDCS Referring Phys: [id number] [facility] J PATWARDHAN  IMPRESSIONS   1. Left ventricular ejection fraction, by estimation, is 60 to 65%. The left ventricle has normal function. The left ventricle has no regional wall motion abnormalities. Left ventricular diastolic parameters were normal. 2. Right ventricular systolic function is normal. The right ventricular size is normal. 3. The mitral valve is abnormal. Trivial mitral valve regurgitation. No evidence of mitral stenosis. 4. Tricuspid valve regurgitation is moderate. 5. Although gradients similar to primary AVA 0.66 cm2 and decreased DVI 0.23 suggests more  severe AS Fusion of right and left cusps . The aortic valve is tricuspid. There is severe calcifcation of the aortic valve. There is severe thickening of the aortic valve. Aortic valve regurgitation is moderate. Severe aortic valve stenosis. 6. Aortic dilatation noted. There is mild dilatation of the ascending aorta, measuring 38 mm. 7. The inferior vena  cava is normal in size with greater than 50% respiratory variability, suggesting right atrial pressure of 3 mmHg.  FINDINGS Left Ventricle: Left ventricular ejection fraction, by estimation, is 60 to 65%. The left ventricle has normal function. The left ventricle has no regional wall motion abnormalities. Strain was performed and the global longitudinal strain is indeterminate. The left ventricular internal cavity size was normal in size. There is no left ventricular hypertrophy. Left ventricular diastolic parameters were normal.  Right Ventricle: The right ventricular size is normal. No increase in right ventricular wall thickness. Right ventricular systolic function is normal.  Left Atrium: Left atrial size was normal in size.  Right Atrium: Right atrial size was normal in size.  Pericardium: There is no evidence of pericardial effusion.  Mitral Valve: The mitral valve is abnormal. There is mild thickening of the mitral valve leaflet(s). There is mild calcification of the mitral valve leaflet(s). Trivial mitral valve regurgitation. No evidence of mitral valve stenosis.  Tricuspid Valve: The tricuspid valve is normal in structure. Tricuspid valve regurgitation is moderate . No evidence of tricuspid stenosis.  Although gradients similar to primary AVA 0.66 cm2 and decreased DVI 0.23 suggests more severe AS Fusion of right and left cusps. The aortic valve is tricuspid. There is severe calcifcation of the aortic valve. There is severe thickening of the aortic valve. Aortic valve regurgitation is moderate. Severe aortic stenosis is  present. Pulmonic Valve: The pulmonic valve was normal in structure. Pulmonic valve regurgitation is not visualized. No evidence of pulmonic stenosis.  Aorta: Aortic dilatation noted. There is mild dilatation of the ascending aorta, measuring 38 mm.  Venous: The inferior vena cava is normal in size with greater than 50% respiratory variability, suggesting right atrial pressure of 3 mmHg.  IAS/Shunts: No atrial level shunt detected by color flow Doppler.  Additional Comments: 3D was performed not requiring image post processing on an independent workstation and was indeterminate.   LEFT VENTRICLE PLAX 2D LVIDd:         4.50 cm   Diastology LVIDs:         2.80 cm   LV e' medial:    8.86 cm/s LV PW:         0.80 cm   LV E/e' medial:  7.5 LV IVS:        0.80 cm   LV e' lateral:   8.86 cm/s LVOT diam:     1.90 cm   LV E/e' lateral: 7.5 LV SV:         59 LV SV Index:   38 LVOT Area:     2.84 cm   RIGHT VENTRICLE             IVC RV Basal diam:  3.50 cm     IVC diam: 1.40 cm RV S prime:     13.80 cm/s TAPSE (M-mode): 2.7 cm  LEFT ATRIUM             Index        RIGHT ATRIUM           Index LA diam:        3.30 cm 2.12 cm/m   RA Area:     14.60 cm LA Vol (A2C):   50.5 ml 32.52 ml/m  RA Volume:   40.20 ml  25.89 ml/m LA Vol (A4C):   41.0 ml 26.40 ml/m LA Biplane Vol: 45.7 ml 29.43 ml/m AORTIC VALVE AV Area (Vmax):    0.68 cm AV Area (Vmean):  0.62 cm AV Area (VTI):     0.66 cm AV Vmax:           357.80 cm/s AV Vmean:          270.000 cm/s AV VTI:            0.890 m AV Peak Grad:      51.2 mmHg AV Mean Grad:      32.0 mmHg LVOT Vmax:         85.90 cm/s LVOT Vmean:        58.650 cm/s LVOT VTI:          0.207 m LVOT/AV VTI ratio: 0.23 AI PHT:            455 msec  AORTA Ao Root diam: 3.20 cm Ao Asc diam:  3.80 cm  MITRAL VALVE               TRICUSPID VALVE MV Area (PHT): 3.60 cm    TR Peak grad:   20.8 mmHg MV Decel Time: 211 msec    TR Vmax:        228.00  cm/s MV E velocity: 66.55 cm/s MV A velocity: 63.90 cm/s  SHUNTS MV E/A ratio:  1.04        Systemic VTI:  0.21 m Systemic Diam: 1.90 cm  Maude Emmer MD Electronically signed by Maude Emmer MD Signature Date/Time: 05/14/2024/2:10:21 PM    Final   TEE  ECHO TEE 11/10/2021  Narrative TRANSESOPHOGEAL ECHO REPORT    Patient Name:   Kayla Ward Date of Exam: 11/10/2021 Medical Rec #:  [id number]        Height:       63.0 in Accession #:    [id number]       Weight:       124.0 lb Date of Birth:  1946/04/12        BSA:          1.578 m Patient Age:    75 years         BP:           112/61 mmHg Patient Gender: F                HR:           64 bpm. Exam Location:  Inpatient  Procedure: Transesophageal Echo, Color Doppler and Cardiac Doppler  Indications:     Aortic Valve Disease  History:         Patient has prior history of Echocardiogram examinations, most recent 10/15/2021. Aortic Valve Disease; Risk Factors:Dyslipidemia.  Sonographer:     Lauraine Pilot RDCS Referring Phys:  [id number] [facility] J PATWARDHAN Diagnosing Phys: Gordy Bergamo MD  PROCEDURE: After discussion of the risks and benefits of a TEE, an informed consent was obtained from the patient. The transesophogeal probe was passed without difficulty through the esophogus of the patient. Sedation performed by different physician. The patient was monitored while under deep sedation. Anesthestetic sedation was provided intravenously by Anesthesiology: 156.5mg  of Propofol . The patient's vital signs; including heart rate, blood pressure, and oxygen  saturation; remained stable throughout the procedure. The patient developed no complications during the procedure.  IMPRESSIONS   1. Left ventricular ejection fraction, by estimation, is 60 to 65%. The left ventricle has normal function. The left ventricle has no regional wall motion abnormalities. 2. Right ventricular systolic function is normal. The right ventricular size is  normal. 3. Left atrial size was mildly dilated. No left atrial/left  atrial appendage thrombus was detected. 4. The mitral valve is normal in structure. Trivial mitral valve regurgitation. No evidence of mitral stenosis. 5. 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 By planimetry, AVA 1.06 cm2. The aortic valve is calcified. There is moderate calcification of the aortic valve. There is mild thickening of the aortic valve. Aortic valve regurgitation is moderate. Mild to moderate aortic valve stenosis. Aortic valve area, by VTI measures 0.82 cm. Aortic valve mean gradient measures 20.3 mmHg. Aortic valve Vmax measures 125.80 m/s.  FINDINGS Left Ventricle: Left ventricular ejection fraction, by estimation, is 60 to 65%. The left ventricle has normal function. The left ventricle has no regional wall motion abnormalities. The left ventricular internal cavity size was normal in size. There is no left ventricular hypertrophy.  Right Ventricle: The right ventricular size is normal. No increase in right ventricular wall thickness. Right ventricular systolic function is normal.  Left Atrium: Left atrial size was mildly dilated. No left atrial/left atrial appendage thrombus was detected.  Right Atrium: Right atrial size was normal in size.  Pericardium: There is no evidence of pericardial effusion.  Mitral Valve: The mitral valve is normal in structure. Trivial mitral valve regurgitation. No evidence of mitral valve stenosis.  Tricuspid Valve: The tricuspid valve is normal in structure. Tricuspid valve regurgitation is trivial. No evidence of tricuspid stenosis.  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 By planimetry, AVA 1.06 cm2. The aortic valve is calcified. There is moderate calcification of the aortic valve. There is mild thickening of the aortic valve. There is mild aortic valve annular calcification. Aortic valve  regurgitation is moderate. Aortic regurgitation PHT measures 321 msec. Mild to moderate aortic stenosis is present. Aortic valve mean gradient measures 20.3 mmHg. Aortic valve peak gradient measures 63302.6 mmHg. Aortic valve area, by VTI measures 0.82 cm.  Pulmonic Valve: The pulmonic valve was normal in structure. Pulmonic valve regurgitation is trivial. No evidence of pulmonic stenosis.  Aorta: The aortic root, ascending aorta and aortic arch are all structurally normal, with no evidence of dilitation or obstruction. There is minimal (Grade I) plaque.  IAS/Shunts: No atrial level shunt detected by color flow Doppler.   LEFT VENTRICLE PLAX 2D LVOT diam:     2.05 cm LV SV:         63 LV SV Index:   40 LVOT Area:     3.30 cm   AORTIC VALVE AV Area (Vmax):    0.03 cm AV Area (Vmean):   1.33 cm AV Area (VTI):     0.82 cm AV Vmax:           12580.00 cm/s AV Vmean:          208.000 cm/s AV VTI:            0.764 m AV Peak Grad:      63302.6 mmHg AV Mean Grad:      20.3 mmHg LVOT Vmax:         121.00 cm/s LVOT Vmean:        83.500 cm/s LVOT VTI:          0.190 m LVOT/AV VTI ratio: 0.25 AI PHT:            321 msec   SHUNTS Systemic VTI:  0.19 m Systemic Diam: 2.05 cm  Gordy Bergamo MD Electronically signed by Gordy Bergamo MD Signature Date/Time: 11/12/2021/11:22:28 AM    Final  MONITORS  LONG TERM  MONITOR (3-14 DAYS) 11/07/2023  Narrative Zio patch monitor 13 days 10/13/2023 - 10/27/2023: Dominant rhythm: Sinus. HR 52-125 bpm. Avg HR 71 bpm, in sinus rhythm. 62 episodes of SVT/atrial tachycardia, fastest at 185 bpm for 12 beats, longest for 15.9 secs at 117 bpm. <1% isolated SVE,  couplet/triplets. 0 episodes of VT. <1% isolated VE, couplets. No atrial fibrillation/atrial flutter/VT/high grade AV block, sinus pause >3sec noted. 8 patient triggered events, correlated with sinus rhythm.        ______________________________________________________________________________________________      ECG NSR    I have independently reviewed the above radiologic studies and discussed with the patient   Recent Lab Findings: Lab Results  Component Value Date   WBC 5.0 05/25/2024   HGB 11.6 (L) 06/05/2024   HGB 11.2 (L) 06/05/2024   HCT 34.0 (L) 06/05/2024   HCT 33.0 (L) 06/05/2024   PLT 193 05/25/2024   GLUCOSE 84 05/25/2024   CHOL 163 12/29/2023   TRIG 101 12/29/2023   HDL 65 12/29/2023   LDLCALC 80 12/29/2023   ALT 10 04/08/2021   AST 15 04/08/2021   NA 142 06/05/2024   NA 142 06/05/2024   K 3.6 06/05/2024   K 3.6 06/05/2024   CL 104 05/25/2024   CREATININE 0.86 05/25/2024   BUN 14 05/25/2024   CO2 25 05/25/2024      Assessment / Plan:   78 y.o. female with severe aortic stenosis and moderate-to-severe AI.  NYHA Class II.  We had a lengthy discussion about the risks and benefits of surgical AVR versus TAVR.  Given her age and anatomy, she and I both agree that TAVR is the appropriate approach.  I will refer her to the TAVR program for additional workup and scheduling to see cardiology.  The risks and benefits of TAVR were discussed in detail.  The risks included death, stroke, paravalvular leak, aortic dissection, annulus rupture, device embolization, acute myocardial infarction, arrhythmia, heart block or need for permanent pacemaker.  We also discussed possibility of an emergent sternotomy to address any procedural complications.  Based on our discussion, we collectively decided that an emergent sternotomy would be indicated.  The patient is agreeable to proceed.  Based on my review of her LHC, echo, and CTA, I agree with the multidisciplinary plan to proceed with a TAVR.  I  spent 45 minutes counseling the patient face to face.   Con RAMAN Krystol Rocco 06/14/2024 11:26 AM

## 2024-06-14 NOTE — Progress Notes (Unsigned)
 Patient ID: Kayla Ward MRN: [id number] DOB/AGE: 1946-09-06 78 y.o.  Primary Care Physician:Wolbach, Bernardino BROCKS, FNP Primary Cardiologist: [place]  CC:  Aortic valvular disease management     FOCUSED PROBLEM LIST:   Mixed aortic valvular disease Aortic stenosis AVA 0.66, DI 0.23, MG 32, SVI 38, V-max 3.6, EF 60-65% TTE August 2025 EKG normal sinus rhythm without bundle-branch blocks Aortic insufficiency Hyperlipidemia CKD stage II BMI 21/BSA 1.26 May 2024:  Patient consents to use of AI scribe. The patient is a 78 year old female with the above listed medical problems referred for recommendations regarding management of her severe symptomatic aortic valvular disease.  She was seen by her primary cardiologist in September and noted increasing exertional dyspnea and chest discomfort.  She underwent coronary angiography which demonstrated no significant obstructive disease.  Echocardiogram demonstrated severe aortic stenosis and moderate aortic insufficiency with preserved ejection fraction.  She was seen by cardiothoracic surgery and a shared decision making process consensus decision made to not prefer surgical aortic valve replacement.  She is here to discuss potential TAVR procedure.  She has been experiencing increasing shortness of breath, particularly with exertion. Activities such as walking quickly to answer the phone or hiking a mile a day have become more challenging, causing her to pant slightly and feel more breathless than before. Her pace has slowed, and she requires more frequent breaks during activities that previously did not require them.  She describes a decrease in her overall energy levels, stating that she used to be able to work for an hour but now needs to take breaks after half an hour. She alternates between work and resting, such as sitting down to read. She also mentions napping almost every afternoon due to fatigue.  She reports experiencing  lightheadedness, particularly when getting up quickly or in the morning, necessitating a brief pause before standing. She recalls a vasovagal syncope episode in January and two similar episodes in the past two and a half years, but no recent blackouts.  No swelling in her legs and her weight remains stable within a five-pound range. She eats healthily and weighs herself frequently. She has difficulty sleeping on her left side due to discomfort but does not experience shortness of breath when lying flat. No unusual bleeding or bruising, except for bruising at the catheterization site from a recent heart catheterization.  No dental issues.        Past Medical History:  Diagnosis Date   Aortic regurgitation    AS (aortic stenosis)    Dysrhythmia    Headache    Hyperlipidemia    Seasonal allergies     Past Surgical History:  Procedure Laterality Date   ESOPHAGOGASTRODUODENOSCOPY N/A 07/17/2021   Procedure: ESOPHAGOGASTRODUODENOSCOPY (EGD);  Surgeon: Rollin Dover, MD;  Location: [facility];  Service: Endoscopy;  Laterality: N/A;   RIGHT/LEFT HEART CATH AND CORONARY ANGIOGRAPHY N/A 11/10/2021   Procedure: RIGHT/LEFT HEART CATH AND CORONARY ANGIOGRAPHY;  Surgeon: [name] PARAS, MD;  Location: [facility];  Service: Cardiovascular;  Laterality: N/A;   RIGHT/LEFT HEART CATH AND CORONARY ANGIOGRAPHY N/A 06/05/2024   Procedure: RIGHT/LEFT HEART CATH AND CORONARY ANGIOGRAPHY;  Surgeon: [name] PARAS, MD;  Location: [facility];  Service: Cardiovascular;  Laterality: N/A;   TEE WITHOUT CARDIOVERSION N/A 11/10/2021   Procedure: TRANSESOPHAGEAL ECHOCARDIOGRAM (TEE);  Surgeon: Ladona Heinz, MD;  Location: [facility];  Service: Cardiovascular;  Laterality: N/A;   UPPER ESOPHAGEAL ENDOSCOPIC ULTRASOUND (EUS) N/A 07/17/2021   Procedure: UPPER ESOPHAGEAL  ENDOSCOPIC ULTRASOUND (EUS);  Surgeon: Rollin Dover, MD;  Location: [facility];  Service: Endoscopy;  Laterality: N/A;     Family History  Problem Relation Age of Onset   Cancer Mother    Thyroid disease Mother    Cancer Father    Heart disease Father    Heart murmur Sister    Kidney disease Son     Social History   Socioeconomic History   Marital status: Married    Spouse name: Not on file   Number of children: 2   Years of education: Not on file   Highest education level: Not on file  Occupational History   Not on file  Tobacco Use   Smoking status: Never   Smokeless tobacco: Never  Vaping Use   Vaping status: Never Used  Substance and Sexual Activity   Alcohol use: Yes    Comment: wine occ   Drug use: No   Sexual activity: Not on file  Other Topics Concern   Not on file  Social History Narrative   Not on file   Social Drivers of Health   Financial Resource Strain: Not on file  Food Insecurity: Low Risk  (05/04/2024)   Received from [facility]   Hunger Vital Sign    Within the past 12 months, you worried that your food would run out before you got money to buy more: Never true    Within the past 12 months, the food you bought just didn't last and you didn't have money to get more. : Never true  Transportation Needs: No Transportation Needs (05/04/2024)   Received from [facility]    In the past 12 months, has lack of reliable transportation kept you from medical appointments, meetings, work or from getting things needed for daily living? : No  Physical Activity: Not on file  Stress: Not on file  Social Connections: Socially Isolated (10/06/2023)   Social Connection and Isolation Panel    Frequency of Communication with Friends and Family: Twice a week    Frequency of Social Gatherings with Friends and Family: Never    Attends Religious Services: Never    Database administrator or Organizations: No    Attends Banker Meetings: Never    Marital Status: Married  Catering manager Violence: Not At Risk (10/06/2023)   Humiliation, Afraid, Rape, and  Kick questionnaire    Fear of Current or Ex-Partner: No    Emotionally Abused: No    Physically Abused: No    Sexually Abused: No     Prior to Admission medications   Medication Sig Start Date End Date Taking? Authorizing Provider  acetaminophen  (TYLENOL ) 500 MG tablet Take 1,000 mg by mouth daily as needed for pain or headache.    [provider]  Artificial Saliva (ACT DRY MOUTH) LOZG Use as directed 1 lozenge in the mouth or throat 2 (two) times daily as needed (dry mouth).    [provider]  ascorbic acid  (VITAMIN C ) 500 MG tablet Take 1,000 mg by mouth daily.    [provider]  BLACK ELDERBERRY PO Take 50 mg by mouth daily. Chewable    [provider]  Calcium -Vitamin D -Vitamin K  650-12.5-40 MG-MCG-MCG CHEW Chew 1 tablet by mouth 2 (two) times daily. Viactiv    [provider]  carboxymethylcellulose (REFRESH PLUS) 0.5 % SOLN Place 1 drop into both eyes daily as needed (dry eyes).    [provider]  cetirizine (ZYRTEC) 10  MG tablet Take 10 mg by mouth daily as needed for allergies.    [provider]  diclofenac  sodium (VOLTAREN ) 1 % GEL Apply 2 g topically daily as needed (right knee pain).    [provider]  famotidine -calcium  carbonate-magnesium hydroxide (PEPCID  COMPLETE) 10-800-165 MG chewable tablet Chew 1 tablet by mouth daily as needed (heartburn, indigestion).    [provider]  Multiple Vitamins-Minerals (WOMENS MULTIVITAMIN GUMMIES) CHEW Chew 1 tablet by mouth daily. Alive    [provider]  rosuvastatin  (CRESTOR ) 10 MG tablet TAKE 1 TABLET BY MOUTH DAILY 11/28/23   Patwardhan, Manish J, MD  sodium chloride  (OCEAN) 0.65 % SOLN nasal spray Place 1 spray into both nostrils in the morning.    [provider]  SUMAtriptan (IMITREX) 50 MG tablet Take 50 mg by mouth daily as needed for migraine. 04/14/22   [provider]    Allergies  Allergen Reactions   Bactrim  [Sulfamethoxazole-Trimethoprim] Hives, Nausea And Vomiting and Other (See Comments)    Syncope    Sulfa Antibiotics Hives   Latex Rash    REVIEW OF SYSTEMS:  General: no fevers/chills/night sweats Eyes: no blurry vision, diplopia, or amaurosis ENT: no sore throat or hearing loss Resp: no cough, wheezing, or hemoptysis CV: no edema or palpitations GI: no abdominal pain, nausea, vomiting, diarrhea, or constipation GU: no dysuria, frequency, or hematuria Skin: no rash Neuro: no headache, numbness, tingling, or weakness of extremities Musculoskeletal: no joint pain or swelling Heme: no bleeding, DVT, or easy bruising Endo: no polydipsia or polyuria  BP 100/60   Pulse 75   Ht 5' 3 (1.6 m)   Wt 119 lb 9.6 oz (54.3 kg)   SpO2 98%   BMI 21.19 kg/m   PHYSICAL EXAM: GEN:  AO x 3 in no acute distress HEENT: normal Dentition: Normal Neck: JVP normal. +2 carotid upstrokes without bruits. No thyromegaly. Lungs: equal expansion, clear bilaterally CV: Apex is discrete and nondisplaced, RRR with 3 out of 6 systolic ejection murmur and diastolic murmur best heard at left lower sternal border Abd: soft, non-tender, non-distended; no bruit; positive bowel sounds Ext: no edema, ecchymoses, or cyanosis Vascular: 2+ femoral pulses, 2+ radial pulses       Skin: warm and dry without rash Neuro: CN II-XII grossly intact; motor and sensory grossly intact    DATA AND STUDIES:  EKG: 2025 normal sinus rhythm without bundle-branch blocks  EKG Interpretation Date/Time:    Ventricular Rate:    PR Interval:    QRS Duration:    QT Interval:    QTC Calculation:   R Axis:      Text Interpretation:          CARDIAC STUDIES: Refer to CV Procedures and Imaging Tabs  05/25/2024: BUN 14; Creatinine, Ser 0.86; Platelets 193 06/05/2024: Hemoglobin 11.6; Hemoglobin 11.2; Potassium 3.6; Potassium 3.6; Sodium 142; Sodium 142   STS RISK CALCULATOR: Pending  NHYA CLASS: 2    ASSESSMENT AND  PLAN:   1. Nonrheumatic aortic valve stenosis   2. CKD (chronic kidney disease) stage 2, GFR 60-89 ml/min     Aortic stenosis: The patient has developed severe aortic valvular disease consisting of severe aortic stenosis and moderate aortic insufficiency.  Fortunately her LV function remains preserved.  Will refer for TAVR protocol CTA.  Further recommendations will be informed by the review of this imaging study. CKD stage II: Monitor for now   I have personally reviewed the patients imaging data as summarized above.  I have reviewed the natural history of aortic stenosis with the patient and family members who are present today. We have discussed the limitations of medical therapy and the poor prognosis associated with symptomatic aortic stenosis. We have also reviewed potential treatment options, including palliative medical therapy, conventional surgical aortic valve replacement, and transcatheter aortic valve replacement. We discussed treatment options in the context of this patient's specific comorbid medical conditions.   All of the patient's questions were answered today. Will make further recommendations based on the results of studies outlined above.   I spent 52 minutes reviewing all clinical data during and prior to this visit including all relevant imaging studies, laboratories, clinical information from other health systems and prior notes from both Cardiology and other specialties, interviewing the patient, conducting a complete physical examination, and coordinating care in order to formulate a comprehensive and personalized evaluation and treatment plan.   Ifeoluwa Bartz K Beth Spackman, MD  06/15/2024 2:17 PM    [facility] [address] [place], [place], [place]  [address] Phone: [phone]; Fax: [phone]

## 2024-06-14 NOTE — Progress Notes (Deleted)
 [address] [place] [address]             [phone]    Kayla Ward [facility] Medical Record #[id number] Date of Birth: Sep 11, 1946  Referring: Kayla Newman PARAS, MD Primary Care: Kayla Bernardino BROCKS, FNP Primary Cardiologist:Kayla Ward [place], MD  Chief Complaint:   No chief complaint on file.   History of Present Illness:     Kayla Ward is a 78 y.o. 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  The aortic valve is tricuspid. There is severe calcifcation of the aortic valve.  There is severe thickening of the aortic valve. Aortic valve regurgitation is moderate. Severe aortic valve stenosis.  Vmax 357, MG 32, AVA 0.66  LV end diastolic pressure is moderately elevated. LVEDP 27 mmHg. RA: 13 mmHg RV: 34/5 mmHg PA: 33/17 mmHg, mPAP 24 mmHg PCW: 17 mmHg  AO sats: 94% PA sats: 67%  CO: 4.4 L/min CI: 2.8 L/min/m2   Normal coronaries.  Annulus: 23.3 x 17.0 mm, diameter 19.9, area 312, peri 62.8 LVOT: 24.2 x 15.9 mm, Dia 19.5, area 300, perimeter 64.5 mm RCA: 18.8 mm, LCA: 16.0 mm SOV 29.8-32.0 Ascending: 3.7 x 3.8 cm  -------------------------------------------  She experiences shortness of breath attributed to the aortic stenosis. Although she had a vasovagal syncope episode in January and has experienced chest pain in the past, she currently reports no chest pain, dizziness, or recent syncope. Patient denies chest pain, palpitations, dyspnea, PND, orthopnea, nausea, vomiting, dizziness, syncope, edema, weight gain, or early satiety. No evaluation for sleep apnea, but she experiences occasional snoring and daytime fatigue. She has not been observed to stop breathing during sleep, and her risk for sleep apnea is considered low based on a screening questionnaire. She reports mild aching in her arm where an IV was placed twice during the procedure, with some bleeding  under the skin and a small bump where the vein was irritated. No pain in her shoulder and a slight tingling sensation when twisting her arm, but no significant pain or weakness. She has a history of modest elevation in cholesterol, which has contributed to calcification of the aortic valve leaflets, leading to stenosis    Past Medical and Surgical History: Previous Chest Surgery: *** Previous Chest Radiation: *** Diabetes Mellitus: ***.  HbA1C *** Anticoagulation: ***, Last dose ***  Creatinine:  Lab Results  Component Value Date   CREATININE 0.86 05/25/2024   CREATININE 0.94 10/07/2023   CREATININE 0.96 10/06/2023     Past Medical History:  Diagnosis Date  . Aortic regurgitation   . AS (aortic stenosis)   . Dysrhythmia   . Headache   . Hyperlipidemia   . Seasonal allergies     Past Surgical History:  Procedure Laterality Date  . ESOPHAGOGASTRODUODENOSCOPY N/A 07/17/2021   Procedure: ESOPHAGOGASTRODUODENOSCOPY (EGD);  Surgeon: Rollin Dover, MD;  Location: [facility];  Service: Endoscopy;  Laterality: N/A;  . RIGHT/LEFT HEART CATH AND CORONARY ANGIOGRAPHY N/A 11/10/2021   Procedure: RIGHT/LEFT HEART CATH AND CORONARY ANGIOGRAPHY;  Surgeon: Kayla Newman PARAS, MD;  Location: [facility];  Service: Cardiovascular;  Laterality: N/A;  . RIGHT/LEFT HEART CATH AND CORONARY ANGIOGRAPHY N/A 06/05/2024   Procedure: RIGHT/LEFT HEART CATH AND CORONARY ANGIOGRAPHY;  Surgeon: Kayla Newman PARAS, MD;  Location: [facility];  Service: Cardiovascular;  Laterality: N/A;  . TEE WITHOUT CARDIOVERSION N/A 11/10/2021  Procedure: TRANSESOPHAGEAL ECHOCARDIOGRAM (TEE);  Surgeon: Ladona Heinz, MD;  Location: [facility]COPY;  Service: Cardiovascular;  Laterality: N/A;  . UPPER ESOPHAGEAL ENDOSCOPIC ULTRASOUND (EUS) N/A 07/17/2021   Procedure: UPPER ESOPHAGEAL ENDOSCOPIC ULTRASOUND (EUS);  Surgeon: Rollin Dover, MD;  Location: [facility];  Service: Endoscopy;  Laterality: N/A;     Social History:  Social History   Tobacco Use  Smoking Status Never  Smokeless Tobacco Never    Social History   Substance and Sexual Activity  Alcohol Use Yes   Comment: wine occ     Allergies  Allergen Reactions  . Bactrim [Sulfamethoxazole-Trimethoprim] Hives, Nausea And Vomiting and Other (See Comments)    Syncope   . Sulfa Antibiotics Hives  . Latex Rash    Medications: Asprin: *** Statin: *** Beta Blocker: *** Ace Inhibitor: *** Anti-Coagulation: ***  Current Outpatient Medications  Medication Sig Dispense Refill  . acetaminophen  (TYLENOL ) 500 MG tablet Take 1,000 mg by mouth daily as needed for pain or headache.    . Artificial Saliva (ACT DRY MOUTH) LOZG Use as directed 1 lozenge in the mouth or throat 2 (two) times daily as needed (dry mouth). (Patient not taking: Reported on 06/08/2024)    . ascorbic acid  (VITAMIN C ) 500 MG tablet Take 1,000 mg by mouth daily.    SABRA BLACK ELDERBERRY PO Take 50 mg by mouth daily. Chewable    . Calcium -Vitamin D -Vitamin K  650-12.5-40 MG-MCG-MCG CHEW Chew 1 tablet by mouth 2 (two) times daily. Viactiv    . carboxymethylcellulose (REFRESH PLUS) 0.5 % SOLN Place 1 drop into both eyes daily as needed (dry eyes).    . cetirizine (ZYRTEC) 10 MG tablet Take 10 mg by mouth daily as needed for allergies.    . diclofenac  sodium (VOLTAREN ) 1 % GEL Apply 2 g topically daily as needed (right knee pain). (Patient not taking: Reported on 06/08/2024)    . famotidine -calcium  carbonate-magnesium hydroxide (PEPCID  COMPLETE) 10-800-165 MG chewable tablet Chew 1 tablet by mouth daily as needed (heartburn, indigestion). (Patient not taking: Reported on 06/08/2024)    . Multiple Vitamins-Minerals (WOMENS MULTIVITAMIN GUMMIES) CHEW Chew 1 tablet by mouth daily. Alive    . rosuvastatin  (CRESTOR ) 10 MG tablet TAKE 1 TABLET BY MOUTH DAILY 90 tablet 3  . sodium chloride  (OCEAN) 0.65 % SOLN nasal spray Place 1 spray into both nostrils in the morning.    .  SUMAtriptan (IMITREX) 50 MG tablet Take 50 mg by mouth daily as needed for migraine. (Patient not taking: Reported on 06/08/2024)     No current facility-administered medications for this visit.    (Not in a hospital admission)   Family History  Problem Relation Age of Onset  . Cancer Mother   . Thyroid disease Mother   . Cancer Father   . Heart disease Father   . Heart murmur Sister   . Kidney disease Son      Review of Systems:   ROS    Physical Exam: There were no vitals taken for this visit. 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  Newman JINNY Lawrence, MD  Findings Coronary Findings Diagnostic  Dominance: Right  Left Main Vessel is normal in caliber. Vessel is angiographically normal.  Left Anterior Descending Vessel is normal in caliber. Vessel is angiographically normal.  Left Circumflex Vessel is normal in caliber. Vessel is angiographically normal.  Right Coronary Artery Vessel is normal in caliber. Vessel is angiographically normal.  Intervention  No interventions have been documented.   CARDIAC CATHETERIZATION  CARDIAC CATHETERIZATION 11/10/2021  Conclusion Images from the original result were not included. Normal coronary arteries Normal filling pressures  Consider noncardiac  etiology for exertional dyspnea   Newman JINNY Lawrence, MD Pager: [phone] Office: [phone]  Findings Coronary Findings Diagnostic  Dominance: Right  Left Main Vessel is normal in caliber. Vessel is angiographically normal.  Left Anterior Descending Vessel is normal in caliber. Vessel is angiographically normal.  Left Circumflex Vessel is normal in caliber. Vessel is angiographically normal.  Right Coronary Artery Vessel is normal in caliber. Vessel is angiographically normal.  Intervention  No interventions have been documented.     ECHOCARDIOGRAM  ECHOCARDIOGRAM COMPLETE 05/14/2024  Narrative ECHOCARDIOGRAM REPORT    Patient Name:   MARTIA DALBY Date of Exam: 05/14/2024 Medical Rec #:  [id number]        Height:       63.0 in Accession #:    [id number]       Weight:       119.4 lb Date of Birth:  [birth year]        BSA:          1.553 m Patient Age:    26 years         BP:           114/62 mmHg Patient Gender: F                HR:           64 bpm. Exam Location:  [address]  Procedure: 2D Echo, Cardiac Doppler and Color Doppler (Both Spectral and Color Flow Doppler were utilized during procedure).  Indications:    I35.0 Aortic Stenosis  History:        Patient has prior history of Echocardiogram examinations, most recent 10/07/2023. Risk Factors:Dyslipidemia.  Sonographer:    Carl Coma RDCS Referring Phys: [id number] [facility] J PATWARDHAN  IMPRESSIONS   1. Left ventricular ejection fraction, by estimation, is 60 to 65%. The left ventricle has normal function. The left ventricle has no regional wall motion abnormalities. Left ventricular diastolic parameters were normal. 2. Right ventricular systolic function is normal. The right ventricular size is normal. 3. The mitral valve is abnormal. Trivial mitral valve regurgitation. No evidence of mitral stenosis. 4. Tricuspid valve regurgitation is moderate. 5. Although gradients similar to  primary AVA 0.66 cm2 and decreased DVI 0.23 suggests more severe AS Fusion of right and left cusps . The aortic valve is tricuspid. There is severe calcifcation of the aortic valve. There is severe thickening of the aortic valve. Aortic valve regurgitation is moderate. Severe aortic valve stenosis. 6. Aortic dilatation noted. There is mild dilatation of the ascending aorta, measuring 38 mm. 7. The inferior vena cava is normal in size with greater than 50% respiratory variability, suggesting right atrial pressure of 3 mmHg.  FINDINGS Left Ventricle: Left ventricular ejection fraction, by estimation, is 60 to 65%. The left ventricle has normal function. The left ventricle has no regional  wall motion abnormalities. Strain was performed and the global longitudinal strain is indeterminate. The left ventricular internal cavity size was normal in size. There is no left ventricular hypertrophy. Left ventricular diastolic parameters were normal.  Right Ventricle: The right ventricular size is normal. No increase in right ventricular wall thickness. Right ventricular systolic function is normal.  Left Atrium: Left atrial size was normal in size.  Right Atrium: Right atrial size was normal in size.  Pericardium: There is no evidence of pericardial effusion.  Mitral Valve: The mitral valve is abnormal. There is mild thickening of the mitral valve leaflet(s). There is mild calcification of the mitral valve leaflet(s). Trivial mitral valve regurgitation. No evidence of mitral valve stenosis.  Tricuspid Valve: The tricuspid valve is normal in structure. Tricuspid valve regurgitation is moderate . No evidence of tricuspid stenosis.  Although gradients similar to primary AVA 0.66 cm2 and decreased DVI 0.23 suggests more severe AS Fusion of right and left cusps. The aortic valve is tricuspid. There is severe calcifcation of the aortic valve. There is severe thickening of the aortic valve. Aortic valve  regurgitation is moderate. Severe aortic stenosis is present. Pulmonic Valve: The pulmonic valve was normal in structure. Pulmonic valve regurgitation is not visualized. No evidence of pulmonic stenosis.  Aorta: Aortic dilatation noted. There is mild dilatation of the ascending aorta, measuring 38 mm.  Venous: The inferior vena cava is normal in size with greater than 50% respiratory variability, suggesting right atrial pressure of 3 mmHg.  IAS/Shunts: No atrial level shunt detected by color flow Doppler.  Additional Comments: 3D was performed not requiring image post processing on an independent workstation and was indeterminate.   LEFT VENTRICLE PLAX 2D LVIDd:         4.50 cm   Diastology LVIDs:         2.80 cm   LV e' medial:    8.86 cm/s LV PW:         0.80 cm   LV E/e' medial:  7.5 LV IVS:        0.80 cm   LV e' lateral:   8.86 cm/s LVOT diam:     1.90 cm   LV E/e' lateral: 7.5 LV SV:         59 LV SV Index:   38 LVOT Area:     2.84 cm   RIGHT VENTRICLE             IVC RV Basal diam:  3.50 cm     IVC diam: 1.40 cm RV S prime:     13.80 cm/s TAPSE (M-mode): 2.7 cm  LEFT ATRIUM             Index        RIGHT ATRIUM           Index LA diam:        3.30 cm 2.12 cm/m   RA Area:     14.60 cm LA Vol (A2C):   50.5 ml 32.52 ml/m  RA Volume:   40.20 ml  25.89 ml/m LA Vol (A4C):   41.0 ml 26.40 ml/m LA Biplane Vol: 45.7 ml 29.43 ml/m AORTIC VALVE AV Area (Vmax):    0.68 cm AV Area (Vmean):   0.62 cm AV Area (VTI):     0.66 cm AV Vmax:           357.80 cm/s AV Vmean:          270.000 cm/s AV VTI:  0.890 m AV Peak Grad:      51.2 mmHg AV Mean Grad:      32.0 mmHg LVOT Vmax:         85.90 cm/s LVOT Vmean:        58.650 cm/s LVOT VTI:          0.207 m LVOT/AV VTI ratio: 0.23 AI PHT:            455 msec  AORTA Ao Root diam: 3.20 cm Ao Asc diam:  3.80 cm  MITRAL VALVE               TRICUSPID VALVE MV Area (PHT): 3.60 cm    TR Peak grad:   20.8 mmHg MV  Decel Time: 211 msec    TR Vmax:        228.00 cm/s MV E velocity: 66.55 cm/s MV A velocity: 63.90 cm/s  SHUNTS MV E/A ratio:  1.04        Systemic VTI:  0.21 m Systemic Diam: 1.90 cm  Maude Emmer MD Electronically signed by Maude Emmer MD Signature Date/Time: 05/14/2024/2:10:21 PM    Final   TEE  ECHO TEE 11/10/2021  Narrative TRANSESOPHOGEAL ECHO REPORT    Patient Name:   HADESSAH GRENNAN Date of Exam: 11/10/2021 Medical Rec #:  [id number]        Height:       63.0 in Accession #:    [id number]       Weight:       124.0 lb Date of Birth:  [birth year]        BSA:          1.578 m Patient Age:    75 years         BP:           112/61 mmHg Patient Gender: F                HR:           64 bpm. Exam Location:  Inpatient  Procedure: Transesophageal Echo, Color Doppler and Cardiac Doppler  Indications:     Aortic Valve Disease  History:         Patient has prior history of Echocardiogram examinations, most recent 10/15/2021. Aortic Valve Disease; Risk Factors:Dyslipidemia.  Sonographer:     Lauraine Pilot RDCS Referring Phys:  [id number] [facility] J PATWARDHAN Diagnosing Phys: Gordy Bergamo MD  PROCEDURE: After discussion of the risks and benefits of a TEE, an informed consent was obtained from the patient. The transesophogeal probe was passed without difficulty through the esophogus of the patient. Sedation performed by different physician. The patient was monitored while under deep sedation. Anesthestetic sedation was provided intravenously by Anesthesiology: 156.5mg  of Propofol . The patient's vital signs; including heart rate, blood pressure, and oxygen  saturation; remained stable throughout the procedure. The patient developed no complications during the procedure.  IMPRESSIONS   1. Left ventricular ejection fraction, by estimation, is 60 to 65%. The left ventricle has normal function. The left ventricle has no regional wall motion abnormalities. 2. Right ventricular systolic  function is normal. The right ventricular size is normal. 3. Left atrial size was mildly dilated. No left atrial/left atrial appendage thrombus was detected. 4. The mitral valve is normal in structure. Trivial mitral valve regurgitation. No evidence of mitral stenosis. 5. 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 By planimetry, AVA 1.06 cm2. The aortic valve is calcified. There is moderate calcification of the aortic  valve. There is mild thickening of the aortic valve. Aortic valve regurgitation is moderate. Mild to moderate aortic valve stenosis. Aortic valve area, by VTI measures 0.82 cm. Aortic valve mean gradient measures 20.3 mmHg. Aortic valve Vmax measures 125.80 m/s.  FINDINGS Left Ventricle: Left ventricular ejection fraction, by estimation, is 60 to 65%. The left ventricle has normal function. The left ventricle has no regional wall motion abnormalities. The left ventricular internal cavity size was normal in size. There is no left ventricular hypertrophy.  Right Ventricle: The right ventricular size is normal. No increase in right ventricular wall thickness. Right ventricular systolic function is normal.  Left Atrium: Left atrial size was mildly dilated. No left atrial/left atrial appendage thrombus was detected.  Right Atrium: Right atrial size was normal in size.  Pericardium: There is no evidence of pericardial effusion.  Mitral Valve: The mitral valve is normal in structure. Trivial mitral valve regurgitation. No evidence of mitral valve stenosis.  Tricuspid Valve: The tricuspid valve is normal in structure. Tricuspid valve regurgitation is trivial. No evidence of tricuspid stenosis.  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 By planimetry, AVA 1.06 cm2. The aortic valve is calcified. There is moderate calcification of the aortic valve. There is mild thickening of the aortic valve. There is mild  aortic valve annular calcification. Aortic valve regurgitation is moderate. Aortic regurgitation PHT measures 321 msec. Mild to moderate aortic stenosis is present. Aortic valve mean gradient measures 20.3 mmHg. Aortic valve peak gradient measures 63302.6 mmHg. Aortic valve area, by VTI measures 0.82 cm.  Pulmonic Valve: The pulmonic valve was normal in structure. Pulmonic valve regurgitation is trivial. No evidence of pulmonic stenosis.  Aorta: The aortic root, ascending aorta and aortic arch are all structurally normal, with no evidence of dilitation or obstruction. There is minimal (Grade I) plaque.  IAS/Shunts: No atrial level shunt detected by color flow Doppler.   LEFT VENTRICLE PLAX 2D LVOT diam:     2.05 cm LV SV:         63 LV SV Index:   40 LVOT Area:     3.30 cm   AORTIC VALVE AV Area (Vmax):    0.03 cm AV Area (Vmean):   1.33 cm AV Area (VTI):     0.82 cm AV Vmax:           12580.00 cm/s AV Vmean:          208.000 cm/s AV VTI:            0.764 m AV Peak Grad:      63302.6 mmHg AV Mean Grad:      20.3 mmHg LVOT Vmax:         121.00 cm/s LVOT Vmean:        83.500 cm/s LVOT VTI:          0.190 m LVOT/AV VTI ratio: 0.25 AI PHT:            321 msec   SHUNTS Systemic VTI:  0.19 m Systemic Diam: 2.05 cm  Gordy Bergamo MD Electronically signed by Gordy Bergamo MD Signature Date/Time: 11/12/2021/11:22:28 AM    Final  MONITORS  LONG TERM MONITOR (3-14 DAYS) 11/07/2023  Narrative Zio patch monitor 13 days 10/13/2023 - 10/27/2023: Dominant rhythm: Sinus. HR 52-125 bpm. Avg HR 71 bpm, in sinus rhythm. 62 episodes of SVT/atrial tachycardia, fastest at 185 bpm for 12 beats, longest for 15.9 secs at 117 bpm. <1% isolated SVE,  couplet/triplets.  0 episodes of VT. <1% isolated VE, couplets. No atrial fibrillation/atrial flutter/VT/high grade AV block, sinus pause >3sec noted. 8 patient triggered events, correlated with sinus rhythm.        ______________________________________________________________________________________________     EKG: *** I have independently reviewed the above radiologic studies and discussed with the patient   Recent Lab Findings: Lab Results  Component Value Date   WBC 5.0 05/25/2024   HGB 11.6 (L) 06/05/2024   HGB 11.2 (L) 06/05/2024   HCT 34.0 (L) 06/05/2024   HCT 33.0 (L) 06/05/2024   PLT 193 05/25/2024   GLUCOSE 84 05/25/2024   CHOL 163 12/29/2023   TRIG 101 12/29/2023   HDL 65 12/29/2023   LDLCALC 80 12/29/2023   ALT 10 04/08/2021   AST 15 04/08/2021   NA 142 06/05/2024   NA 142 06/05/2024   K 3.6 06/05/2024   K 3.6 06/05/2024   CL 104 05/25/2024   CREATININE 0.86 05/25/2024   BUN 14 05/25/2024   CO2 25 05/25/2024      Assessment / Plan:   78 y.o. female with ***     I  spent {CHL ONC TIME VISIT - DTPQU:[id number]} counseling the patient face to face.   Con RAMAN Aaric Dolph 06/14/2024 6:51 AM

## 2024-06-15 ENCOUNTER — Ambulatory Visit: Attending: Internal Medicine | Admitting: Internal Medicine

## 2024-06-15 VITALS — BP 100/60 | HR 75 | Ht 63.0 in | Wt 119.6 lb

## 2024-06-15 DIAGNOSIS — I350 Nonrheumatic aortic (valve) stenosis: Secondary | ICD-10-CM | POA: Insufficient documentation

## 2024-06-15 DIAGNOSIS — N182 Chronic kidney disease, stage 2 (mild): Secondary | ICD-10-CM | POA: Diagnosis present

## 2024-06-15 NOTE — Progress Notes (Signed)
 Pre Surgical Assessment: 5 M Walk Test  58M=16.39ft  5 Meter Walk Test- trial 1: 5.44 seconds 5 Meter Walk Test- trial 2: 5.44 seconds 5 Meter Walk Test- trial 3: 5.24 seconds 5 Meter Walk Test Average: 5.37 seconds

## 2024-06-15 NOTE — Patient Instructions (Signed)
 Medication Instructions:  Your physician recommends that you continue on your current medications as directed. Please refer to the Current Medication list given to you today.  *If you need a refill on your cardiac medications before your next appointment, please call your pharmacy*   Follow-Up: At [facility], you and your health needs are our priority.  As part of our continuing mission to provide you with exceptional heart care, our providers are all part of one team.  This team includes your primary Cardiologist (physician) and Advanced Practice Providers or APPs (Physician Assistants and Nurse Practitioners) who all work together to provide you with the care you need, when you need it.  Your next appointment:   Our structural team will arrange follow-up.  Provider:   Lurena Red, MD  We recommend signing up for the patient portal called MyChart.  Sign up information is provided on this After Visit Summary.  MyChart is used to connect with patients for Virtual Visits (Telemedicine).  Patients are able to view lab/test results, encounter notes, upcoming appointments, etc.  Non-urgent messages can be sent to your provider as well.    To learn more about what you can do with MyChart, go to [email or web address].

## 2024-06-18 ENCOUNTER — Other Ambulatory Visit: Payer: Self-pay

## 2024-06-18 DIAGNOSIS — I350 Nonrheumatic aortic (valve) stenosis: Secondary | ICD-10-CM

## 2024-06-25 ENCOUNTER — Ambulatory Visit (HOSPITAL_COMMUNITY)
Admission: RE | Admit: 2024-06-25 | Discharge: 2024-06-25 | Disposition: A | Discharge status: Home / Self Care | Source: Ambulatory Visit | Attending: Internal Medicine | Admitting: Internal Medicine

## 2024-06-25 ENCOUNTER — Ambulatory Visit: Payer: Self-pay | Admitting: Internal Medicine

## 2024-06-25 DIAGNOSIS — I350 Nonrheumatic aortic (valve) stenosis: Secondary | ICD-10-CM | POA: Insufficient documentation

## 2024-06-25 MED ORDER — IOHEXOL 350 MG/ML SOLN
100.0000 mL | Freq: Once | INTRAVENOUS | Status: AC | PRN
  Administered 2024-06-25: 100 mL via INTRAVENOUS

## 2024-06-25 NOTE — Progress Notes (Signed)
 Procedure Type: Isolated AVR Perioperative Outcome Estimate % Operative Mortality 2.37% Morbidity & Mortality 8.07% Stroke 1.87% Renal Failure 1.06% Reoperation 4.26% Prolonged Ventilation 3.57% Deep Sternal Wound Infection 0.029% Long Hospital Stay (>14 days) 3.44% Short Hospital Stay (<6 days)* 44.3%

## 2024-07-06 ENCOUNTER — Other Ambulatory Visit: Payer: Self-pay

## 2024-07-06 DIAGNOSIS — I350 Nonrheumatic aortic (valve) stenosis: Secondary | ICD-10-CM

## 2024-07-13 ENCOUNTER — Encounter (HOSPITAL_COMMUNITY)
Admission: RE | Admit: 2024-07-13 | Discharge: 2024-07-13 | Disposition: A | Discharge status: Home / Self Care | Source: Ambulatory Visit | Attending: Internal Medicine | Admitting: Internal Medicine

## 2024-07-13 ENCOUNTER — Ambulatory Visit (HOSPITAL_COMMUNITY)
Admission: RE | Admit: 2024-07-13 | Discharge: 2024-07-13 | Disposition: A | Discharge status: Home / Self Care | Source: Ambulatory Visit | Attending: Internal Medicine | Admitting: Internal Medicine

## 2024-07-13 DIAGNOSIS — Z01818 Encounter for other preprocedural examination: Secondary | ICD-10-CM | POA: Insufficient documentation

## 2024-07-13 DIAGNOSIS — I350 Nonrheumatic aortic (valve) stenosis: Secondary | ICD-10-CM | POA: Insufficient documentation

## 2024-07-13 LAB — TYPE AND SCREEN
ABO/RH(D): AB POS
Antibody Screen: NEGATIVE

## 2024-07-13 LAB — URINALYSIS, ROUTINE W REFLEX MICROSCOPIC
Bilirubin Urine: NEGATIVE
Glucose, UA: NEGATIVE mg/dL
Hgb urine dipstick: NEGATIVE
Ketones, ur: NEGATIVE mg/dL
Nitrite: NEGATIVE
Protein, ur: NEGATIVE mg/dL
Specific Gravity, Urine: 1.004 — ABNORMAL LOW (ref 1.005–1.030)
pH: 7 (ref 5.0–8.0)

## 2024-07-13 LAB — COMPREHENSIVE METABOLIC PANEL WITH GFR
ALT: 17 U/L (ref 0–44)
AST: 21 U/L (ref 15–41)
Albumin: 3.9 g/dL (ref 3.5–5.0)
Alkaline Phosphatase: 39 U/L (ref 38–126)
Anion gap: 9 (ref 5–15)
BUN: 10 mg/dL (ref 8–23)
CO2: 28 mmol/L (ref 22–32)
Calcium: 9.5 mg/dL (ref 8.9–10.3)
Chloride: 101 mmol/L (ref 98–111)
Creatinine, Ser: 0.91 mg/dL (ref 0.44–1.00)
GFR, Estimated: >60 mL/min (ref >60)
Glucose, Bld: 95 mg/dL (ref 70–99)
Potassium: 3.9 mmol/L (ref 3.5–5.1)
Sodium: 138 mmol/L (ref 135–145)
Total Bilirubin: 0.9 mg/dL (ref 0.0–1.2)
Total Protein: 6.8 g/dL (ref 6.5–8.1)

## 2024-07-13 LAB — CBC
HCT: 39.8 % (ref 36.0–46.0)
Hemoglobin: 13.3 g/dL (ref 12.0–15.0)
MCH: 31.2 pg (ref 26.0–34.0)
MCHC: 33.4 g/dL (ref 30.0–36.0)
MCV: 93.4 fL (ref 80.0–100.0)
Platelets: 174 K/uL (ref 150–400)
RBC: 4.26 MIL/uL (ref 3.87–5.11)
RDW: 12.4 % (ref 11.5–15.5)
WBC: 5.1 K/uL (ref 4.0–10.5)
nRBC: 0 % (ref 0.0–0.2)

## 2024-07-13 LAB — PROTIME-INR
INR: 1 (ref 0.8–1.2)
Prothrombin Time: 13.8 s (ref 11.4–15.2)

## 2024-07-13 LAB — SURGICAL PCR SCREEN
MRSA, PCR: NEGATIVE
Staphylococcus aureus: NEGATIVE

## 2024-07-13 NOTE — Progress Notes (Signed)
 All consents signed by patient at PAT lab appointment. Pt was sent home with printed copy of surgical instructions and CHG soap/CHG soap instructions. All instructions reviewed with patient and questions answered.  Patients chart send to anesthesia for review. Pt denies any respiratory illness/infection in the last two months.

## 2024-07-16 MED ORDER — DEXMEDETOMIDINE HCL IN NACL 400 MCG/100ML IV SOLN
0.1000 ug/kg/h | INTRAVENOUS | Status: AC
  Administered 2024-07-17: .7 ug/kg/h via INTRAVENOUS
  Administered 2024-07-17: 54.32 ug via INTRAVENOUS
  Filled 2024-07-16: qty 100

## 2024-07-16 MED ORDER — CEFAZOLIN SODIUM-DEXTROSE 2-4 GM/100ML-% IV SOLN
2.0000 g | INTRAVENOUS | Status: AC
  Administered 2024-07-17: 2 g via INTRAVENOUS
  Filled 2024-07-16: qty 100

## 2024-07-16 MED ORDER — HEPARIN 30,000 UNITS/1000 ML (OHS) CELLSAVER SOLUTION
Status: DC
  Filled 2024-07-16: qty 1000

## 2024-07-16 MED ORDER — MAGNESIUM SULFATE 50 % IJ SOLN
40.0000 meq | INTRAMUSCULAR | Status: DC
Start: 2024-07-17 — End: 2024-07-18
  Filled 2024-07-16: qty 9.85

## 2024-07-16 MED ORDER — POTASSIUM CHLORIDE 2 MEQ/ML IV SOLN
80.0000 meq | INTRAVENOUS | Status: DC
  Filled 2024-07-16: qty 40

## 2024-07-16 MED ORDER — NOREPINEPHRINE 4 MG/250ML-% IV SOLN
0.0000 ug/min | INTRAVENOUS | Status: DC
Start: 2024-07-17 — End: 2024-07-18
  Filled 2024-07-16: qty 250

## 2024-07-17 ENCOUNTER — Other Ambulatory Visit: Payer: Self-pay

## 2024-07-17 ENCOUNTER — Inpatient Hospital Stay (HOSPITAL_COMMUNITY): Admitting: Anesthesiology

## 2024-07-17 ENCOUNTER — Encounter (HOSPITAL_COMMUNITY): Payer: Self-pay | Admitting: Internal Medicine

## 2024-07-17 ENCOUNTER — Encounter (HOSPITAL_COMMUNITY): Admission: RE | Disposition: A | Discharge status: Home / Self Care | Payer: Self-pay | Source: Home / Self Care

## 2024-07-17 ENCOUNTER — Inpatient Hospital Stay (HOSPITAL_COMMUNITY): Payer: Self-pay | Admitting: Physician Assistant

## 2024-07-17 ENCOUNTER — Inpatient Hospital Stay (HOSPITAL_COMMUNITY)
Admission: RE | Admit: 2024-07-17 | Discharge: 2024-07-18 | DRG: 267 | Disposition: A | Discharge status: Home / Self Care

## 2024-07-17 ENCOUNTER — Inpatient Hospital Stay (HOSPITAL_COMMUNITY)

## 2024-07-17 DIAGNOSIS — Z888 Allergy status to other drugs, medicaments and biological substances status: Secondary | ICD-10-CM

## 2024-07-17 DIAGNOSIS — I77810 Thoracic aortic ectasia: Secondary | ICD-10-CM | POA: Diagnosis present

## 2024-07-17 DIAGNOSIS — I350 Nonrheumatic aortic (valve) stenosis: Secondary | ICD-10-CM

## 2024-07-17 DIAGNOSIS — E782 Mixed hyperlipidemia: Secondary | ICD-10-CM | POA: Diagnosis present

## 2024-07-17 DIAGNOSIS — Z952 Presence of prosthetic heart valve: Secondary | ICD-10-CM | POA: Diagnosis not present

## 2024-07-17 DIAGNOSIS — I5032 Chronic diastolic (congestive) heart failure: Secondary | ICD-10-CM | POA: Diagnosis present

## 2024-07-17 DIAGNOSIS — Z91040 Latex allergy status: Secondary | ICD-10-CM

## 2024-07-17 DIAGNOSIS — I352 Nonrheumatic aortic (valve) stenosis with insufficiency: Principal | ICD-10-CM | POA: Diagnosis present

## 2024-07-17 DIAGNOSIS — Z006 Encounter for examination for normal comparison and control in clinical research program: Secondary | ICD-10-CM | POA: Diagnosis not present

## 2024-07-17 DIAGNOSIS — I359 Nonrheumatic aortic valve disorder, unspecified: Secondary | ICD-10-CM

## 2024-07-17 DIAGNOSIS — N182 Chronic kidney disease, stage 2 (mild): Secondary | ICD-10-CM | POA: Diagnosis present

## 2024-07-17 DIAGNOSIS — Z882 Allergy status to sulfonamides status: Secondary | ICD-10-CM | POA: Diagnosis not present

## 2024-07-17 HISTORY — PX: INTRAOPERATIVE TRANSTHORACIC ECHOCARDIOGRAM: SHX6523

## 2024-07-17 HISTORY — DX: Nonrheumatic aortic valve disorder, unspecified: I35.9

## 2024-07-17 LAB — ECHOCARDIOGRAM LIMITED
AR max vel: 2.06 cm2
AV Area VTI: 2.37 cm2
AV Area mean vel: 2.28 cm2
AV Mean grad: 4 mmHg
AV Peak grad: 8.4 mmHg
Ao pk vel: 1.45 m/s
P 1/2 time: 380 ms

## 2024-07-17 LAB — POCT I-STAT, CHEM 8
BUN: 9 mg/dL (ref 8–23)
Calcium, Ion: 1.2 mmol/L (ref 1.15–1.40)
Chloride: 105 mmol/L (ref 98–111)
Creatinine, Ser: 0.7 mg/dL (ref 0.44–1.00)
Glucose, Bld: 101 mg/dL — ABNORMAL HIGH (ref 70–99)
HCT: 30 % — ABNORMAL LOW (ref 36.0–46.0)
Hemoglobin: 10.2 g/dL — ABNORMAL LOW (ref 12.0–15.0)
Potassium: 4.1 mmol/L (ref 3.5–5.1)
Sodium: 141 mmol/L (ref 135–145)
TCO2: 26 mmol/L (ref 22–32)

## 2024-07-17 LAB — ABO/RH: ABO/RH(D): AB POS

## 2024-07-17 LAB — GLUCOSE, CAPILLARY: Glucose-Capillary: 81 mg/dL (ref 70–99)

## 2024-07-17 LAB — POCT ACTIVATED CLOTTING TIME: Activated Clotting Time: 204 s

## 2024-07-17 MED ORDER — CHLORHEXIDINE GLUCONATE 4 % EX SOLN
60.0000 mL | Freq: Once | CUTANEOUS | Status: DC
  Filled 2024-07-17: qty 60

## 2024-07-17 MED ORDER — LORATADINE 10 MG PO TABS
10.0000 mg | ORAL_TABLET | Freq: Every day | ORAL | Status: DC
  Filled 2024-07-17 (×2): qty 1

## 2024-07-17 MED ORDER — SODIUM CHLORIDE 0.9 % IV SOLN: INTRAVENOUS | Status: DC

## 2024-07-17 MED ORDER — NITROGLYCERIN IN D5W 200-5 MCG/ML-% IV SOLN: 0.0000 ug/min | INTRAVENOUS | Status: DC

## 2024-07-17 MED ORDER — CHLORHEXIDINE GLUCONATE 0.12 % MT SOLN
15.0000 mL | Freq: Once | OROMUCOSAL | Status: AC
  Administered 2024-07-17: 15 mL via OROMUCOSAL
  Filled 2024-07-17: qty 15

## 2024-07-17 MED ORDER — NOREPINEPHRINE 4 MG/250ML-% IV SOLN: 0.0000 ug/min | INTRAVENOUS | Status: DC

## 2024-07-17 MED ORDER — ROSUVASTATIN CALCIUM 5 MG PO TABS
10.0000 mg | ORAL_TABLET | Freq: Every day | ORAL | Status: DC
  Administered 2024-07-17 – 2024-07-18 (×2): 10 mg via ORAL
  Filled 2024-07-17 (×2): qty 2

## 2024-07-17 MED ORDER — SODIUM CHLORIDE 0.9 % IV SOLN: 250.0000 mL | INTRAVENOUS | Status: DC | PRN

## 2024-07-17 MED ORDER — PROTAMINE SULFATE 10 MG/ML IV SOLN
INTRAVENOUS | Status: DC | PRN
  Administered 2024-07-17 (×2): 20 mg via INTRAVENOUS
  Administered 2024-07-17: 30 mg via INTRAVENOUS

## 2024-07-17 MED ORDER — IODIXANOL 320 MG/ML IV SOLN
INTRAVENOUS | Status: DC | PRN
  Administered 2024-07-17: 50 mL via INTRA_ARTERIAL

## 2024-07-17 MED ORDER — OXYCODONE HCL 5 MG PO TABS: 5.0000 mg | ORAL_TABLET | ORAL | Status: DC | PRN

## 2024-07-17 MED ORDER — SODIUM CHLORIDE 0.9 % IV SOLN: INTRAVENOUS | Status: AC

## 2024-07-17 MED ORDER — SODIUM CHLORIDE 0.9% FLUSH
3.0000 mL | Freq: Two times a day (BID) | INTRAVENOUS | Status: DC
  Administered 2024-07-18: 3 mL via INTRAVENOUS

## 2024-07-17 MED ORDER — ONDANSETRON HCL 4 MG/2ML IJ SOLN
INTRAMUSCULAR | Status: DC | PRN
  Administered 2024-07-17: 4 mg via INTRAVENOUS

## 2024-07-17 MED ORDER — ASPIRIN 81 MG PO CHEW
81.0000 mg | CHEWABLE_TABLET | Freq: Every day | ORAL | Status: DC
  Administered 2024-07-18: 81 mg via ORAL
  Filled 2024-07-17: qty 1

## 2024-07-17 MED ORDER — ACETAMINOPHEN 500 MG PO TABS
1000.0000 mg | ORAL_TABLET | Freq: Once | ORAL | Status: AC
  Administered 2024-07-17: 500 mg via ORAL
  Filled 2024-07-17: qty 2

## 2024-07-17 MED ORDER — PHENYLEPHRINE HCL-NACL 20-0.9 MG/250ML-% IV SOLN
INTRAVENOUS | Status: DC | PRN
  Administered 2024-07-17: 20 ug/min via INTRAVENOUS

## 2024-07-17 MED ORDER — PROPOFOL 10 MG/ML IV BOLUS
INTRAVENOUS | Status: DC | PRN
  Administered 2024-07-17: 30 mg via INTRAVENOUS

## 2024-07-17 MED ORDER — ACETAMINOPHEN 650 MG RE SUPP
650.0000 mg | Freq: Four times a day (QID) | RECTAL | Status: DC | PRN
  Filled 2024-07-17: qty 1

## 2024-07-17 MED ORDER — ONDANSETRON HCL 4 MG/2ML IJ SOLN: 4.0000 mg | Freq: Four times a day (QID) | INTRAMUSCULAR | Status: DC | PRN

## 2024-07-17 MED ORDER — CHLORHEXIDINE GLUCONATE 4 % EX SOLN
30.0000 mL | CUTANEOUS | Status: DC
  Filled 2024-07-17: qty 30

## 2024-07-17 MED ORDER — HEPARIN (PORCINE) IN NACL 2000-0.9 UNIT/L-% IV SOLN
INTRAVENOUS | Status: DC | PRN
  Administered 2024-07-17: 1000 mL

## 2024-07-17 MED ORDER — MORPHINE SULFATE (PF) 2 MG/ML IV SOLN: 1.0000 mg | INTRAVENOUS | Status: DC | PRN

## 2024-07-17 MED ORDER — LIDOCAINE HCL (PF) 1 % IJ SOLN
INTRAMUSCULAR | Status: DC | PRN
  Administered 2024-07-17: 2 mL
  Administered 2024-07-17: 12 mL

## 2024-07-17 MED ORDER — SODIUM CHLORIDE 0.9% FLUSH: 3.0000 mL | INTRAVENOUS | Status: DC | PRN

## 2024-07-17 MED ORDER — CHLORHEXIDINE GLUCONATE 4 % EX SOLN: 60.0000 mL | Freq: Once | CUTANEOUS | Status: DC

## 2024-07-17 MED ORDER — SODIUM CHLORIDE 0.9 % IV SOLN: INTRAVENOUS | Status: DC | PRN

## 2024-07-17 MED ORDER — ACETAMINOPHEN 325 MG PO TABS: 650.0000 mg | ORAL_TABLET | Freq: Four times a day (QID) | ORAL | Status: DC | PRN

## 2024-07-17 MED ORDER — FAMOTIDINE 20 MG PO TABS: 10.0000 mg | ORAL_TABLET | Freq: Two times a day (BID) | ORAL | Status: DC | PRN

## 2024-07-17 MED ORDER — TRAMADOL HCL 50 MG PO TABS: 50.0000 mg | ORAL_TABLET | ORAL | Status: DC | PRN

## 2024-07-17 MED ORDER — HEPARIN SODIUM (PORCINE) 1000 UNIT/ML IJ SOLN
INTRAMUSCULAR | Status: DC | PRN
  Administered 2024-07-17: 3000 [IU] via INTRAVENOUS
  Administered 2024-07-17: 4100 [IU] via INTRAVENOUS

## 2024-07-17 MED ORDER — CEFAZOLIN SODIUM-DEXTROSE 2-4 GM/100ML-% IV SOLN
2.0000 g | Freq: Three times a day (TID) | INTRAVENOUS | Status: AC
  Administered 2024-07-17 – 2024-07-18 (×2): 2 g via INTRAVENOUS
  Filled 2024-07-17 (×2): qty 100

## 2024-07-17 NOTE — H&P (View-Only) (Signed)
 Brief Progress Note  No changes since last seen in clinic.  Feels well the last couple weeks.  Vitals:   07/17/24 0923  BP: 116/67  Pulse: 91  Resp: 18  Temp: 97.7 F (36.5 C)  SpO2: 97%   Plan: Proceed today with TF TAVR  Con Clunes, MD Cardiothoracic Surgery Pager: [phone]

## 2024-07-17 NOTE — Interval H&P Note (Signed)
 History and Physical Interval Note:  07/17/2024 12:07 PM  Kayla Ward  has presented today for surgery, with the diagnosis of Severe Aortic Stenosis.  The various methods of treatment have been discussed with the patient and family. After consideration of risks, benefits and other options for treatment, the patient has consented to  Procedure(s): Transcatheter Aortic Valve Replacement, Transfemoral (Right) ECHOCARDIOGRAM, TRANSTHORACIC (N/A) as a surgical intervention.  The patient's history has been reviewed, patient examined, no change in status, stable for surgery.  I have reviewed the patient's chart and labs.  Questions were answered to the patient's satisfaction.     Ludy Messamore K Gayatri Teasdale

## 2024-07-17 NOTE — Discharge Instructions (Signed)

## 2024-07-17 NOTE — Discharge Summary (Signed)
 [facility]ART [facility] VASCULAR [facility]ENTER   MULTIDISCIPLINARY HEART VALVE TEAM  Discharge Summary    Patient ID: Kayla Ward MRN: [id number]; DOB: [birth year]  Admit date: 07/17/2024 Discharge date: 07/18/2024  PCP:  Anita Bernardino BROCKS, FNP  [facility] Cardiologist:  Newman JINNY Lawrence, MD  [facility] Structural heart: Lurena MARLA Red, MD [facility] Electrophysiologist:  None   Discharge Diagnoses    Principal Problem:   S/P TAVR (transcatheter aortic valve replacement) Active Problems:   Mixed hyperlipidemia   Ascending aorta dilatation   Nonrheumatic aortic valve disorder   Allergies Allergies  Allergen Reactions   Bactrim [Sulfamethoxazole-Trimethoprim] Hives, Nausea And Vomiting and Other (See Comments)    Syncope    Sulfa Antibiotics Hives   Latex Rash    Diagnostic Studies/Procedures    [facility]ASC[facility]ENTER  TAVR OPERATIVE NOTE     Date of Procedure:                07/17/2024   Preoperative Diagnosis:      Severe Aortic Stenosis    Postoperative Diagnosis:    Same    Procedure:        Transcatheter Aortic Valve Replacement - Transfemoral Approach             Edwards Sapien 3 Resilia THV (size 23 mm, model # 9755RLS, serial # [id number])              Co-Surgeons:                        Con Clunes, MD and Lurena Red, MD Anesthesiologist:                  Epifanio   Echocardiographer:              Santo   Pre-operative Echo Findings: Severe aortic stenosis Normal left ventricular systolic function   Post-operative Echo Findings: No paravalvular leak Normal left ventricular systolic function   Left Heart Catheterization Findings: Left ventricular end-diastolic pressure of 13 mmHg _____________    Echo 07/18/24: completed but pending formal read at the time of discharge   History of Present Illness     Kayla Ward is a 77 y.o. 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   Echo 05/14/24 showed EF 60%, moderate TR, and mod-severe AI/AS with a mean grad 32 mmHg, Vmax 3.57 m/s, AVA 0.66 cm2, DVI 0.23, SVI 38. She noted worsening shortness of breath with exertion and fatigue over the last couple years. She also reported chest tightness with exertion such as walking uphill. She also has palpitations and had a vasovagal syncopal episode in January in which she hit her head and fractured her neck. 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   The patient was evaluated by the multidisciplinary valve team and felt to have severe, symptomatic aortic stenosis and to be a suitable candidate for TAVR, which was set up for 07/17/24.  Hospital Course     Consultants: none   Severe AS/AI:  -- S/p successful TAVR with a 23 mm Edwards Sapien 3 Ultra Resilia THV via the TF approach on 07/17/24.  -- Post operative echo completed but pending formal read. -- Groin sites  are stable.  -- ECG with sinus and no high grade heart block. -- Tele with short run of SVT- asymptomatic.  -- Started on a baby Asprin 81mg  daily.  -- Met with cardiac rehab to discuss CRP phase II.  -- Plan for discharge home today with close follow up in the outpatient setting.   HFpEF: -- LVEDP 13. -- She appears euvolemic off diuretics.   HLD: -- Continue Crestor  10mg  daily.   Thrombocytopenia:  -- Plts dropped to 125 which is expected post operatively.    _____________  Discharge Vitals Blood pressure 103/64, pulse 71, temperature 99.2 F (37.3 C), temperature source Oral, resp. rate 20, height 5' 3 (1.6 m), weight 52.8 kg, SpO2 97%.  Filed Weights   07/16/24 1200 07/17/24 0923 07/18/24 0456  Weight: 54.3 kg 52.2 kg 52.8 kg      GEN: Well nourished, well developed in no acute distress NECK: No JVD CARDIAC: RRR, no murmurs, rubs, gallops RESPIRATORY:  Clear to auscultation without rales, wheezing or rhonchi  ABDOMEN: Soft, non-tender, non-distended EXTREMITIES:  No edema; No deformity.  Groin sites clear without hematoma or ecchymosis.    Disposition   Pt is being discharged home today in good condition.  Follow-up Plans & Appointments     Follow-up Information     Sebastian Lamarr SAUNDERS, PA-C. Go on 07/23/2024.   Specialties: Cardiology, Radiology Why: @ 9:55am, please arrive at least 20 minutes early. Contact information: [address] [place] [address] [phone]                Discharge Instructions     Amb Referral to Cardiac Rehabilitation   Complete by: As directed    Diagnosis: Valve Replacement   Valve: Aortic Comment - tavr   After initial evaluation and assessments completed: Virtual Based Care may be provided alone or in conjunction with Phase 2 Cardiac Rehab based on patient barriers.: Yes   Intensive Cardiac Rehabilitation (ICR) [facility] location only OR Traditional Cardiac Rehabilitation (TCR) *If criteria for ICR are not met will enroll in TCR [facility] only): Yes       Discharge Medications   Allergies as of 07/18/2024       Reactions   Bactrim [sulfamethoxazole-trimethoprim] Hives, Nausea And Vomiting, Other (See Comments)   Syncope    Sulfa Antibiotics Hives   Latex Rash        Medication List     TAKE these medications    acetaminophen  500 MG tablet Commonly known as: TYLENOL  Take 500-1,000 mg by mouth every 6 (six) hours as needed.   ACT Dry Mouth Lozg Use as directed 1 lozenge in the mouth or throat 2 (two) times daily as needed (dry mouth).   aspirin  81 MG chewable tablet Chew 1 tablet (81 mg total) by mouth daily.   BLACK ELDERBERRY PO Take 50 mg by mouth in the morning. Chewable   Calcium -Vitamin D -Vitamin K  650-12.5-40 MG-MCG-MCG  Chew Chew 1 tablet by mouth 2 (two) times daily. Viactiv   carboxymethylcellulose 0.5 % Soln Commonly known as: REFRESH PLUS Place 1 drop into both eyes in the morning.   cetirizine 10 MG tablet Commonly known as: ZYRTEC Take 10 mg by mouth daily as needed for allergies.   diclofenac  sodium 1 % Gel Commonly known as: VOLTAREN  Apply 2 g topically 3 (three) times daily as needed (knee pain).   famotidine  10 MG tablet Commonly known as: PEPCID  Take 10 mg by mouth 2 (two) times daily as needed for heartburn or indigestion.  rosuvastatin  10 MG tablet Commonly known as: CRESTOR  TAKE 1 TABLET BY MOUTH DAILY   sodium chloride  0.65 % Soln nasal spray Commonly known as: OCEAN Place 1 spray into both nostrils in the morning.   SUMAtriptan 50 MG tablet Commonly known as: IMITREX Take 50 mg by mouth daily as needed for migraine.   VITAMIN C  PO Take 500 mg by mouth 2 (two) times daily.   Womens Multivitamin Gummies Chew Chew 2 tablets by mouth in the morning. Alive         Outstanding Labs/Studies   None   ______________________  Duration of Discharge Encounter: APP Time: 31 minutes    Signed, Lamarr Hummer, PA-C 07/18/2024, 10:21 AM [phone]

## 2024-07-17 NOTE — Anesthesia Preprocedure Evaluation (Addendum)
 Anesthesia Evaluation  Patient identified by MRN, date of birth, ID band Patient awake    Reviewed: Allergy & Precautions, H&P , NPO status , Patient's Chart, lab work & pertinent test results  Airway Mallampati: II  TM Distance: >3 FB Neck ROM: Full    Dental no notable dental hx. (+) Teeth Intact, Dental Advisory Given   Pulmonary neg pulmonary ROS   Pulmonary exam normal breath sounds clear to auscultation       Cardiovascular + Valvular Problems/Murmurs AS  Rhythm:Regular Rate:Normal + Systolic murmurs    Neuro/Psych  Headaches  negative psych ROS   GI/Hepatic negative GI ROS, Neg liver ROS,,,  Endo/Other  negative endocrine ROS    Renal/GU negative Renal ROS  negative genitourinary   Musculoskeletal   Abdominal   Peds  Hematology negative hematology ROS (+)   Anesthesia Other Findings   Reproductive/Obstetrics negative OB ROS                              Anesthesia Physical Anesthesia Plan  ASA: 4  Anesthesia Plan: MAC   Post-op Pain Management: Tylenol  PO (pre-op)* and Precedex   Induction: Intravenous  PONV Risk Score and Plan: 2 and Propofol  infusion and Ondansetron   Airway Management Planned: Natural Airway and Simple Face Mask  Additional Equipment: Arterial line  Intra-op Plan:   Post-operative Plan:   Informed Consent: I have reviewed the patients History and Physical, chart, labs and discussed the procedure including the risks, benefits and alternatives for the proposed anesthesia with the patient or authorized representative who has indicated his/her understanding and acceptance.     Dental advisory given  Plan Discussed with: CRNA  Anesthesia Plan Comments:          Anesthesia Quick Evaluation

## 2024-07-17 NOTE — Progress Notes (Signed)
 Bilat. Groin checks per protocol.  Both sites are a Level 0.  Report has been called to 4E  and the patient will be transferred.

## 2024-07-17 NOTE — Anesthesia Postprocedure Evaluation (Signed)
 Anesthesia Post Note  Patient: Kayla Ward  Procedure(s) Performed: Transcatheter Aortic Valve Replacement, Transfemoral (Right) ECHOCARDIOGRAM, TRANSTHORACIC     Patient location during evaluation: Cath Lab Anesthesia Type: MAC Level of consciousness: awake and alert Pain management: pain level controlled Vital Signs Assessment: post-procedure vital signs reviewed and stable Respiratory status: spontaneous breathing, nonlabored ventilation and respiratory function stable Cardiovascular status: stable and blood pressure returned to baseline Postop Assessment: no apparent nausea or vomiting Anesthetic complications: no   There were no known notable events for this encounter.  Last Vitals:  Vitals:   07/17/24 1450 07/17/24 1455  BP: 104/66 99/67  Pulse: (!) 52 (!) 52  Resp: 11 11  Temp:    SpO2: 100% 99%    Last Pain:  Vitals:   07/17/24 1455  TempSrc:   PainSc: 0-No pain                 Weslie Rasmus,W. EDMOND

## 2024-07-17 NOTE — Progress Notes (Signed)
  [facility] VASCULAR CENTER   MULTIDISCIPLINARY HEART VALVE TEAM  Patient doing well s/p TAVR. She is hemodynamically stable. Groin sites stable. ECG with no high grade block. Tele with sinus brady in high 40s. Transferred from cath lab holding to 4E.  Early ambulation after bedrest completed and hopeful discharge over the next 24-48 hours.   Lamarr Hummer PA-C  [facility]  Pager [phone]

## 2024-07-17 NOTE — Progress Notes (Signed)
 Brief Progress Note  No changes since last seen in clinic.  Feels well the last couple weeks.  Vitals:   07/17/24 0923  BP: 116/67  Pulse: 91  Resp: 18  Temp: 97.7 F (36.5 C)  SpO2: 97%   Plan: Proceed today with TF TAVR  Con Clunes, MD Cardiothoracic Surgery Pager: [phone]

## 2024-07-17 NOTE — Transfer of Care (Signed)
 Immediate Anesthesia Transfer of Care Note  Patient: Kayla Ward  Procedure(s) Performed: Transcatheter Aortic Valve Replacement, Transfemoral (Right) ECHOCARDIOGRAM, TRANSTHORACIC  Patient Location: PACU  Anesthesia Type:MAC  Level of Consciousness: awake, alert , and oriented  Airway & Oxygen  Therapy: Patient Spontanous Breathing  Post-op Assessment: Report given to RN and Post -op Vital signs reviewed and stable  Post vital signs: Reviewed and stable  Last Vitals:  Vitals Value Taken Time  BP 106/56 07/17/24 13:45  Temp    Pulse 56 07/17/24 13:46  Resp 11 07/17/24 13:46  SpO2 98 % 07/17/24 13:46  Vitals shown include unfiled device data.  Last Pain:  Vitals:   07/17/24 0957  TempSrc:   PainSc: 0-No pain         Complications: There were no known notable events for this encounter.

## 2024-07-17 NOTE — Op Note (Signed)
 HEART AND VASCULAR CENTER  TAVR OPERATIVE NOTE   Date of Procedure:  07/17/2024  Preoperative Diagnosis: Severe Aortic Stenosis   Postoperative Diagnosis: Same   Procedure:   Transcatheter Aortic Valve Replacement - Transfemoral Approach  Edwards Sapien 3 Resilia THV (size 23 mm, model # 9755RLS, serial # [id number])   Co-Surgeons:  Con Clunes, MD and Lurena Red, MD Anesthesiologist:  Epifanio  Echocardiographer:  Santo  Pre-operative Echo Findings: Severe aortic stenosis Normal left ventricular systolic function  Post-operative Echo Findings: No paravalvular leak Normal left ventricular systolic function  Left Heart Catheterization Findings: Left ventricular end-diastolic pressure of 13 mmHg   BRIEF CLINICAL NOTE AND INDICATIONS FOR SURGERY  The patient is 78 year old female with a history of hyperlipidemia, CKD stage II, a 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  During the course of the patient's preoperative work up they have been evaluated comprehensively by a multidisciplinary team of specialists coordinated through the Multidisciplinary Heart Valve Clinic in the [facility].  They have been demonstrated to suffer from symptomatic severe aortic stenosis as noted above. The patient has been counseled extensively as to the relative risks and benefits of all options for the treatment of severe aortic stenosis including long term medical therapy, conventional surgery for aortic valve replacement, and transcatheter aortic valve replacement.  The patient has been independently evaluated by Dr. Clunes with CT surgery and they are felt to be at high risk for conventional surgical aortic valve replacement. The surgeon indicated the patient would be a poor candidate for conventional  surgery. Based upon review of all of the patient's preoperative diagnostic tests they are felt to be candidate for transcatheter aortic valve replacement using the transfemoral approach as an alternative to high risk conventional surgery.    Following the decision to proceed with transcatheter aortic valve replacement, a discussion has been held regarding what types of management strategies would be attempted intraoperatively in the event of life-threatening complications, including whether or not the patient would be considered a candidate for the use of cardiopulmonary bypass and/or conversion to open sternotomy for attempted surgical intervention.  The patient has been advised of a variety of complications that might develop peculiar to this approach including but not limited to risks of death, stroke, paravalvular leak, aortic dissection or other major vascular complications, aortic annulus rupture, device embolization, cardiac rupture or perforation, acute myocardial infarction, arrhythmia, heart block or bradycardia requiring permanent pacemaker placement, congestive heart failure, respiratory failure, renal failure, pneumonia, infection, other late complications related to structural valve deterioration or migration, or other complications that might ultimately cause a temporary or permanent loss of functional independence or other long term morbidity.  The patient provides full informed consent for the procedure as described and all questions were answered preoperatively.    DETAILS OF THE OPERATIVE PROCEDURE  PREPARATION:   The patient is brought to the operating room on the above mentioned date and central monitoring was established by the anesthesia team. The patient is placed in the supine position on the operating table.  Intravenous antibiotics are administered. Conscious sedation is used.   Baseline transthoracic echocardiogram was performed. The patient's chest, abdomen, both groins, and  both lower extremities are prepared and draped in a sterile manner. A time out procedure is performed.   PERIPHERAL ACCESS:   Using the modified Seldinger technique, femoral  arterial and venous access were obtained with placement of a 6 Fr sheath in the left common femoral artery and a 6 Fr sheath using u/s guidance.  A pigtail diagnostic catheter was passed through the femoral arterial sheath under fluoroscopic guidance into the aortic root.  Aortic root angiography was performed in order to determine the optimal angiographic angle for valve deployment.  TRANSFEMORAL ACCESS:  A micropuncture kit was used to gain access to the right common femoral artery using u/s guidance. Position confirmed with angiography. Pre-closure with double ProGlide closure devices. The patient was heparinized systemically and ACT verified > 250 seconds.    A 14 Fr transfemoral E-sheath was introduced into the right common femoral artery after progressively dilating over an Amplatz superstiff wire. An AL-1 catheter was used to direct a straight-tip exchange length wire across the native aortic valve into the left ventricle. This was exchanged out for a pigtail catheter and position was confirmed in the LV apex. Simultaneous left ventricular, aortic, and left ventricular end-diastolic pressures were recorded.  The pigtail catheter was then exchanged for an Safari wire in the LV apex.  Direct LV pacing thresholds were assessed and found to be adequate.   TRANSCATHETER HEART VALVE DEPLOYMENT:  An Edwards Sapien 3 THV (size 23 mm) was prepared and crimped per manufacturer's guidelines, and the proper orientation of the valve is confirmed on the [employer] delivery system. The valve was advanced through the introducer sheath using normal technique until in an appropriate position in the abdominal aorta beyond the sheath tip. The balloon was then retracted and using the fine-tuning wheel was centered on the valve. The valve  was then advanced across the aortic arch using appropriate flexion of the catheter. The valve was carefully positioned across the aortic valve annulus. The Commander catheter was retracted using normal technique. Once final position of the valve has been confirmed by angiographic assessment, the valve is deployed while temporarily holding ventilation and during rapid ventricular pacing to maintain systolic blood pressure < 50 mmHg and pulse pressure < 10 mmHg. The balloon inflation is held for >3 seconds after reaching full deployment volume. Once the balloon has fully deflated the balloon is retracted into the ascending aorta and valve function is assessed using TTE. There is felt to be no paravalvular leak and no central aortic insufficiency.  The patient's hemodynamic recovery following valve deployment is good.  The deployment balloon and guidewire are both removed. Echo demostrated acceptable post-procedural gradients, stable mitral valve function, and no AI.   PROCEDURE COMPLETION:  The sheath was then removed and closure devices were completed. Protamine was administered once femoral arterial repair was complete. The temporary pacemaker, pigtail catheters and femoral sheaths were removed with  a Mynx closure device placed in the artery and manual pressure used for venous hemostasis.    The patient tolerated the procedure well and is transported to the surgical intensive care in stable condition. There were no immediate intraoperative complications. All sponge instrument and needle counts are verified correct at completion of the operation.   No blood products were administered during the operation.  The patient received a total of 50 mL of intravenous contrast during the procedure.  Genae Strine K Braxley Balandran MD 07/17/2024 1:53 PM

## 2024-07-17 NOTE — Op Note (Signed)
 HEART AND VASCULAR CENTER   MULTIDISCIPLINARY HEART VALVE TEAM   TAVR OPERATIVE NOTE   Date of Procedure:  07/17/2024  Preoperative Diagnosis: Severe Aortic Stenosis   Postoperative Diagnosis: Same   Procedure:   Transcatheter Aortic Valve Replacement - Percutaneous Transfemoral Approach  Edwards Sapien 3 Ultra Resilia (size 23 mm, model # 9755RSL, serial # [id number] )   Co-Surgeons:  Con Clunes, MD and Lurena Red, MD   Anesthesiologist:  Epifanio, MD  Echocardiographer:  Santo, MD  Pre-operative Echo Findings: Severe aortic stenosis Normal left ventricular systolic function  Post-operative Echo Findings: No paravalvular leak Normal left ventricular systolic function   BRIEF CLINICAL NOTE AND INDICATIONS FOR SURGERY The patient is 78 year old female with a history of hyperlipidemia, CKD stage II, a 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   DETAILS OF THE OPERATIVE PROCEDURE  PREPARATION:    The patient was brought to the operating room on the above mentioned date and appropriate monitoring was established by the anesthesia team. The patient was placed in the supine position on the operating table.  Intravenous antibiotics were administered. The patient was monitored closely throughout the procedure under conscious sedation.  Baseline transthoracic echocardiogram was performed. The patient's abdomen and both groins were prepped and draped in a sterile manner. A time out procedure was performed.   PERIPHERAL ACCESS:    Using the modified Seldinger technique, femoral arterial access was obtained with placement of 6 Fr sheath on the left side.  A pigtail diagnostic catheter was passed through the femoral arterial sheath under fluoroscopic guidance into the aortic root.  Aortic root angiography  was performed in order to determine the optimal angiographic angle for valve deployment.   TRANSFEMORAL ACCESS:   Percutaneous transfemoral access and sheath placement was performed using ultrasound guidance.  The right common femoral artery was cannulated using a micropuncture needle and appropriate location was verified using hand injection angiogram.  A pair of Abbott Perclose percutaneous closure devices were placed and a 6 French sheath replaced into the femoral artery.  The patient was heparinized systemically and ACT verified > 250 seconds.    A 14 Fr transfemoral E-sheath was introduced into the right common femoral artery after progressively dilating over an Amplatz superstiff wire. An AL-1 catheter was used to direct a straight-tip exchange length wire across the native aortic valve into the left ventricle. This was exchanged out for a pigtail catheter and position was confirmed in the LV apex. Simultaneous left ventricular, aortic, and left ventricular end-diastolic pressures were recorded.  The pigtail catheter was then exchanged for an Safari wire in the LV apex.  Direct LV pacing thresholds were assessed and found to be adequate.   TRANSCATHETER HEART VALVE DEPLOYMENT:   An Edwards Sapien 3 Ultra transcatheter heart valve (23 mm) was prepared and crimped per manufacturer's guidelines, and the proper orientation of the valve was confirmed on the [employer] delivery system. The valve was advanced through the introducer sheath using normal technique until in an appropriate position in the abdominal aorta beyond the sheath tip. The balloon was then retracted and using the fine-tuning wheel was centered on the valve. The valve was then advanced across the aortic arch using appropriate flexion of the catheter. The valve was carefully positioned across the aortic valve annulus. The Commander catheter was retracted using normal technique. Once final position of the  valve was confirmed by  angiographic assessment, the valve was deployed during rapid ventricular pacing to maintain systolic blood pressure < 50 mmHg and pulse pressure < 10 mmHg. The balloon inflation was held for >3 seconds after reaching full deployment volume. Once the balloon was fully deflated the balloon was retracted into the ascending aorta and valve function was assessed using echocardiography. There was felt to be no paravalvular leak and no central aortic insufficiency.  The patient's hemodynamic recovery following valve deployment was good.  The deployment balloon and guidewire were both removed.    PROCEDURE COMPLETION:   The sheath was removed and femoral artery closure performed.  Protamine was administered once femoral arterial repair was complete. The pigtail catheter was removed and a Mynx femoral closure device was utilized following removal of the diagnostic sheath in the left femoral artery.  The patient tolerated the procedure well and was transported to the cath lab recovery area in stable condition. There were no immediate intraoperative complications. All sponge instrument and needle counts were verified correct at completion of the operation.   No blood products were administered during the operation.  The patient received a total of 50 mL of intravenous contrast during the procedure.   Con GORMAN Clunes, MD 07/17/2024 2:11 PM

## 2024-07-18 ENCOUNTER — Encounter (HOSPITAL_COMMUNITY): Payer: Self-pay | Admitting: Internal Medicine

## 2024-07-18 ENCOUNTER — Inpatient Hospital Stay (HOSPITAL_COMMUNITY)

## 2024-07-18 DIAGNOSIS — Z952 Presence of prosthetic heart valve: Secondary | ICD-10-CM | POA: Diagnosis not present

## 2024-07-18 LAB — BASIC METABOLIC PANEL WITH GFR
Anion gap: 11 (ref 5–15)
BUN: 10 mg/dL (ref 8–23)
CO2: 23 mmol/L (ref 22–32)
Calcium: 8.9 mg/dL (ref 8.9–10.3)
Chloride: 104 mmol/L (ref 98–111)
Creatinine, Ser: 0.82 mg/dL (ref 0.44–1.00)
GFR, Estimated: >60 mL/min (ref >60)
Glucose, Bld: 103 mg/dL — ABNORMAL HIGH (ref 70–99)
Potassium: 4.1 mmol/L (ref 3.5–5.1)
Sodium: 138 mmol/L (ref 135–145)

## 2024-07-18 LAB — MAGNESIUM: Magnesium: 2.1 mg/dL (ref 1.7–2.4)

## 2024-07-18 LAB — CBC
HCT: 37.2 % (ref 36.0–46.0)
Hemoglobin: 13 g/dL (ref 12.0–15.0)
MCH: 31.5 pg (ref 26.0–34.0)
MCHC: 34.9 g/dL (ref 30.0–36.0)
MCV: 90.1 fL (ref 80.0–100.0)
Platelets: 125 K/uL — ABNORMAL LOW (ref 150–400)
RBC: 4.13 MIL/uL (ref 3.87–5.11)
RDW: 12.2 % (ref 11.5–15.5)
WBC: 8.9 K/uL (ref 4.0–10.5)
nRBC: 0 % (ref 0.0–0.2)

## 2024-07-18 LAB — ECHOCARDIOGRAM COMPLETE
AR max vel: 2.33 cm2
AV Area VTI: 2.4 cm2
AV Area mean vel: 2.32 cm2
AV Mean grad: 9 mmHg
AV Peak grad: 17.8 mmHg
Ao pk vel: 2.11 m/s
Area-P 1/2: 3.1 cm2
Calc EF: 64.5 %
Height: 63 in
S' Lateral: 2.6 cm
Single Plane A2C EF: 60.1 %
Single Plane A4C EF: 61 %
Weight: 1864 [oz_av]

## 2024-07-18 MED ORDER — ASPIRIN 81 MG PO CHEW: 81.0000 mg | CHEWABLE_TABLET | Freq: Every day | ORAL | Status: AC

## 2024-07-18 NOTE — Progress Notes (Signed)
 HEART AND VASCULAR CENTER   MULTIDISCIPLINARY HEART VALVE CLINIC                                     Cardiology Office Note:    Date:  07/23/2024   ID:  MATEJA DIER, DOB 1946/04/12, MRN [id number]  PCP:  Anita Bernardino BROCKS, FNP  [facility] Cardiologist:  Newman JINNY Lawrence, MD  [facility] Structural heart: Lurena MARLA Red, MD [facility] Electrophysiologist:  None   Referring MD: Anita Bernardino BROCKS, FNP   TOC s/p TAVR  History of Present Illness:    Kayla Ward is a 78 y.o. 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  Echo 05/14/24 showed EF 60%, moderate TR, and mod-severe AI/AS with a mean grad 32 mmHg, Vmax 3.57 m/s, AVA 0.66 cm2, DVI 0.23, SVI 38. She noted worsening shortness of breath with exertion and fatigue over the last couple years. She also reported chest tightness with exertion such as walking uphill. She also has palpitations and had a vasovagal syncopal episode in January in which she hit her head and fractured her neck. 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 S/p successful TAVR with a 23 mm Edwards Sapien 3 Ultra Resilia THV via the TF approach on 07/17/24. Post op echo showed EF 60%, moderate TR, and normally functioning TAVR with a mean gradient of 9 mmHg and no PVL. Started on a baby Asprin 81mg  daily and discharged on POD 1.  Today the patient presents to clinic for follow up. Here alone. She has had a left groin lump that is painful. Size of an acorn. Right side healing well- covered with bandage. No CP or SOB. No LE edema, orthopnea or PND. No dizziness or syncope. No blood in stool or urine. Had a few seconds of fluttering that self  resolved. Thought she might get a migraine but didn't.     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 Red and Dr. Daniel   Seasonal allergies      Current Medications: Current Meds  Medication Sig   acetaminophen  (TYLENOL ) 500 MG tablet Take 500-1,000 mg by mouth every 6 (six) hours as needed.   amoxicillin (AMOXIL) 500 MG tablet Take 4 tablets (2,000 mg total) by mouth as directed. 1 hour prior to dental work including cleanings   Artificial Saliva (ACT DRY MOUTH) LOZG Use as directed 1 lozenge in the mouth or throat 2 (two) times daily as needed (dry mouth).   Ascorbic Acid  (VITAMIN C  PO) Take 500 mg by mouth 2 (two) times daily.   aspirin  81 MG chewable tablet Chew 1 tablet (81 mg total) by mouth daily.   BLACK ELDERBERRY PO Take 50 mg by mouth in the morning. Chewable   Calcium -Vitamin D -Vitamin K  650-12.5-40 MG-MCG-MCG CHEW Chew 1 tablet by mouth 2 (two) times daily. Viactiv   carboxymethylcellulose (REFRESH PLUS) 0.5 % SOLN Place 1 drop  into both eyes in the morning.   cetirizine (ZYRTEC) 10 MG tablet Take 10 mg by mouth daily as needed for allergies.   diclofenac  sodium (VOLTAREN ) 1 % GEL Apply 2 g topically 3 (three) times daily as needed (knee pain).   famotidine  (PEPCID ) 10 MG tablet Take 10 mg by mouth 2 (two) times daily as needed for heartburn or indigestion.   Multiple Vitamins-Minerals (WOMENS MULTIVITAMIN GUMMIES) CHEW Chew 2 tablets by mouth in the morning. Alive   rosuvastatin  (CRESTOR ) 10 MG tablet TAKE 1 TABLET BY MOUTH DAILY   sodium chloride  (OCEAN) 0.65 % SOLN nasal spray Place 1 spray into both nostrils in the morning.   SUMAtriptan (IMITREX) 50 MG tablet Take 50 mg by mouth daily as needed for migraine.      ROS:   Please see the history of  present illness.    All other systems reviewed and are negative.  EKGs   EKG Interpretation Date/Time:  Monday July 23 2024 09:58:04 EST Ventricular Rate:  85 PR Interval:  166 QRS Duration:  122 QT Interval:  388 QTC Calculation: 461 R Axis:   44  Text Interpretation: Normal sinus rhythm Anteroseptal infarct , age undetermined ST & T wave abnormality, consider lateral ischemia When compared with ECG of 18-Jul-2024 03:41, QRS duration has increased Confirmed by Sebastian Collar [phone]) on 07/23/2024 10:31:12 AM   Risk Assessment/Calculations:           Physical Exam:    VS:  BP 98/62   Pulse 85   Ht 5' 3 (1.6 m)   Wt 118 lb 12.8 oz (53.9 kg)   SpO2 96%   BMI 21.04 kg/m     Wt Readings from Last 3 Encounters:  07/23/24 118 lb 12.8 oz (53.9 kg)  07/18/24 116 lb 8 oz (52.8 kg)  06/15/24 119 lb 9.6 oz (54.3 kg)     GEN: appears younger than her stated age. NECK: No JVD CARDIAC: RRR, no murmurs, rubs, gallops RESPIRATORY:  Clear to auscultation without rales, wheezing or rhonchi  ABDOMEN: Soft, non-tender, non-distended EXTREMITIES:  No edema; No deformity.  Groin site without ecchymosis.  She has had a left groin lump that is painful to palpation. Size of an acorn. Right side healing well- covered with bandage.   ASSESSMENT:    1. S/P TAVR (transcatheter aortic valve replacement)   2. Chronic heart failure with preserved ejection fraction (HFpEF) (HCC)   3. Mixed hyperlipidemia   4. Right groin pain   5. Left groin pain     PLAN:    In order of problems listed above:  Severe AS s/p TAVR:  -- Pt doing excellent s/p TAVR.  -- ECG with no HAVB.  -- Groin sites healing well.  -- SBE prophylaxis discussed; I have RX'd amoxicillin.   -- Continue Aspirin  81mg  daily. -- Cleared to resume all activities without restriction. -- I will see back for 1 month echo and OV.  HFpEF: -- She appears euvolemic off diuretics.    HLD: -- Continue Crestor  10mg  daily.    Left groin lump:  -- Tender to palpation but no bruit. -- I suspect this will just end up being a hematoma, but will get a left groin US  to rule out PSA.      Cardiac Rehabilitation Eligibility Assessment  The patient is ready to start cardiac rehabilitation from a cardiac standpoint.      Medication Adjustments/Labs and Tests Ordered: Current medicines are reviewed at length with the patient today.  Concerns regarding medicines are outlined above.  Orders Placed This Encounter  Procedures   EKG 12-Lead   ECHOCARDIOGRAM COMPLETE   VAS US  GROIN PSEUDOANEURYSM   Meds ordered this encounter  Medications   amoxicillin (AMOXIL) 500 MG tablet    Sig: Take 4 tablets (2,000 mg total) by mouth as directed. 1 hour prior to dental work including cleanings    Dispense:  12 tablet    Refill:  12    Supervising Provider:   WONDA SHARPER [[id number]]    Patient Instructions  Medication Instructions:  Your physician has recommended you make the following change in your medication:  START Amoxicillin 500 mg, take 4 tablets by mouth 1 hour prior to dental procedures and cleanings.    *If you need a refill on your cardiac medications before your next appointment, please call your pharmacy*  Lab Work: None needed If you have labs (blood work) drawn today and your tests are completely normal, you will receive your results only by: MyChart Message (if you have MyChart) OR A paper copy in the mail If you have any lab test that is abnormal or we need to change your treatment, we will call you to review the results.  Testing/Procedures: 09/03/24 Your physician has requested that you have an echocardiogram. Echocardiography is a painless test that uses sound waves to create images of your heart. It provides your doctor with information about the size and shape of your heart and how well your heart's chambers and valves are working. This procedure takes approximately one hour. There are no  restrictions for this procedure. Please do NOT wear cologne, perfume, aftershave, or lotions (deodorant is allowed). Please arrive 15 minutes prior to your appointment time.  Please note: We ask at that you not bring children with you during ultrasound (echo/ vascular) testing. Due to room size and safety concerns, children are not allowed in the ultrasound rooms during exams. Our front office staff cannot provide observation of children in our lobby area while testing is being conducted. An adult accompanying a patient to their appointment will only be allowed in the ultrasound room at the discretion of the ultrasound technician under special circumstances. We apologize for any inconvenience.   Follow-Up: At [facility], you and your health needs are our priority.  As part of our continuing mission to provide you with exceptional heart care, our providers are all part of one team.  This team includes your primary Cardiologist (physician) and Advanced Practice Providers or APPs (Physician Assistants and Nurse Practitioners) who all work together to provide you with the care you need, when you need it.  Your next appointment:   As scheduled on 09/03/24  Provider:   Izetta Hummer, PA-C  We recommend signing up for the patient portal called MyChart.  Sign up information is provided on this After Visit Summary.  MyChart is used to connect with patients for Virtual Visits (Telemedicine).  Patients are able to view lab/test results, encounter notes, upcoming appointments, etc.  Non-urgent messages can be sent to your provider as well.   To learn more about what you can do with MyChart, go to [email or web address].   Other Instructions A vascular ultrasound of your right groin has been ordered, you will go downstairs to the 2nd floor and they will check in you there.      Signed, Lamarr Hummer, PA-C  07/23/2024 10:40 AM    [facility]

## 2024-07-18 NOTE — Progress Notes (Signed)
  Echocardiogram 2D Echocardiogram has been performed.  Devora Ellouise SAUNDERS 07/18/2024, 10:26 AM

## 2024-07-18 NOTE — Progress Notes (Signed)
 Discussed with pt restrictions, exercise, and CRPII. Pt receptive. She walks daily. Will refer to Dr [facility] CRPII.  [phone] Aliene Aris BS, ACSM-CEP 07/18/2024 9:19 AM

## 2024-07-18 NOTE — Plan of Care (Signed)

## 2024-07-19 ENCOUNTER — Telehealth: Payer: Self-pay

## 2024-07-19 ENCOUNTER — Telehealth (HOSPITAL_COMMUNITY): Payer: Self-pay

## 2024-07-19 NOTE — Telephone Encounter (Signed)
 Patient contacted regarding discharge from [facility] on 07/18/24  Patient understands to follow up with provider Izetta Hummer, PA-C on 07/23/24 at 9:55AM at [address] Location. Patient understands discharge instructions? Yes Patient understands medications and regiment? Yes Patient understands to bring all medications to this visit? Yes

## 2024-07-19 NOTE — Telephone Encounter (Signed)
 Phase II referral. Attempted to call patient regarding cardiac rehab- no answer, left message. Sent MyChart message.  F/u 11/03, 12/15.

## 2024-07-20 ENCOUNTER — Telehealth (HOSPITAL_COMMUNITY): Payer: Self-pay

## 2024-07-20 NOTE — Telephone Encounter (Signed)
 Pt insurance is active and benefits verified through Medicare B. Co-pay $0, DED $257/$257 met, out of pocket $0/$0 met, co-insurance 20%. No pre-authorization required. Passport, 07/20/2024 @ 9:31am, REF# [id number].  TCR/ICR? ICR Visit(date of service)limitation? No Can multiple codes be used on the same date of service/visit?(IF ITS A LIMIT) N/A  Is this a lifetime maximum or an annual maximum? Annual Has the member used any of these services to date? No Is there a time limit (weeks/months) on start of program and/or program completion? No

## 2024-07-21 ENCOUNTER — Telehealth: Payer: Self-pay | Admitting: Student

## 2024-07-21 NOTE — Telephone Encounter (Signed)
   The patient called the after-hours line reporting that she has noticed some swelling along her left groin site from recent TAVR. She says it is a small area and less than 1 inch and is firm to touch. No significant pain, erythema or drainage noted. Says it was present yesterday and feels more firm today. Reviewed with the patient that it sounds most consistent with a hematoma. We reviewed that if the site continues to expand or she develops significant pain, she should proceed to the ED for ultrasound evaluation. At this time, I encouraged her to mark the border of the site with a marker and to make us  aware if this continues to increase in size. Otherwise, she has a follow-up appointment on 07/23/2024 and her site can be reassessed at that time. She voiced understanding of this and was appreciative of the return call.  Signed, Laymon CHRISTELLA Qua, PA-C 07/21/2024, 11:25 AM Pager: [phone]

## 2024-07-23 ENCOUNTER — Ambulatory Visit
Admission: RE | Admit: 2024-07-23 | Discharge: 2024-07-23 | Disposition: A | Discharge status: Home / Self Care | Source: Ambulatory Visit | Attending: Physician Assistant | Admitting: Physician Assistant

## 2024-07-23 ENCOUNTER — Ambulatory Visit (INDEPENDENT_AMBULATORY_CARE_PROVIDER_SITE_OTHER): Admitting: Physician Assistant

## 2024-07-23 VITALS — BP 98/62 | HR 85 | Ht 63.0 in | Wt 118.8 lb

## 2024-07-23 DIAGNOSIS — Z952 Presence of prosthetic heart valve: Secondary | ICD-10-CM | POA: Diagnosis present

## 2024-07-23 DIAGNOSIS — R1032 Left lower quadrant pain: Secondary | ICD-10-CM | POA: Insufficient documentation

## 2024-07-23 DIAGNOSIS — R1031 Right lower quadrant pain: Secondary | ICD-10-CM

## 2024-07-23 DIAGNOSIS — I5032 Chronic diastolic (congestive) heart failure: Secondary | ICD-10-CM | POA: Insufficient documentation

## 2024-07-23 DIAGNOSIS — E782 Mixed hyperlipidemia: Secondary | ICD-10-CM | POA: Diagnosis not present

## 2024-07-23 MED ORDER — AMOXICILLIN 500 MG PO TABS: 2000.0000 mg | ORAL_TABLET | ORAL | 12 refills | Status: AC

## 2024-07-23 NOTE — Patient Instructions (Addendum)
 Medication Instructions:  Your physician has recommended you make the following change in your medication:  START Amoxicillin 500 mg, take 4 tablets by mouth 1 hour prior to dental procedures and cleanings.    *If you need a refill on your cardiac medications before your next appointment, please call your pharmacy*  Lab Work: None needed If you have labs (blood work) drawn today and your tests are completely normal, you will receive your results only by: MyChart Message (if you have MyChart) OR A paper copy in the mail If you have any lab test that is abnormal or we need to change your treatment, we will call you to review the results.  Testing/Procedures: 09/03/24 Your physician has requested that you have an echocardiogram. Echocardiography is a painless test that uses sound waves to create images of your heart. It provides your doctor with information about the size and shape of your heart and how well your heart's chambers and valves are working. This procedure takes approximately one hour. There are no restrictions for this procedure. Please do NOT wear cologne, perfume, aftershave, or lotions (deodorant is allowed). Please arrive 15 minutes prior to your appointment time.  Please note: We ask at that you not bring children with you during ultrasound (echo/ vascular) testing. Due to room size and safety concerns, children are not allowed in the ultrasound rooms during exams. Our front office staff cannot provide observation of children in our lobby area while testing is being conducted. An adult accompanying a patient to their appointment will only be allowed in the ultrasound room at the discretion of the ultrasound technician under special circumstances. We apologize for any inconvenience.   Follow-Up: At [facility], you and your health needs are our priority.  As part of our continuing mission to provide you with exceptional heart care, our providers are all part of one team.   This team includes your primary Cardiologist (physician) and Advanced Practice Providers or APPs (Physician Assistants and Nurse Practitioners) who all work together to provide you with the care you need, when you need it.  Your next appointment:   As scheduled on 09/03/24  Provider:   Izetta Hummer, PA-C  We recommend signing up for the patient portal called MyChart.  Sign up information is provided on this After Visit Summary.  MyChart is used to connect with patients for Virtual Visits (Telemedicine).  Patients are able to view lab/test results, encounter notes, upcoming appointments, etc.  Non-urgent messages can be sent to your provider as well.   To learn more about what you can do with MyChart, go to [email or web address].   Other Instructions A vascular ultrasound of your right groin has been ordered, you will go downstairs to the 2nd floor and they will check in you there.

## 2024-08-21 ENCOUNTER — Telehealth (HOSPITAL_COMMUNITY): Payer: Self-pay

## 2024-08-21 NOTE — Telephone Encounter (Signed)
 Attempted to call regarding cardiac rehab- no answer, left message. Confirmed patient read but did not respond to last MyChart message sent regarding cardiac rehab.

## 2024-08-22 ENCOUNTER — Telehealth (HOSPITAL_COMMUNITY): Payer: Self-pay

## 2024-08-22 NOTE — Telephone Encounter (Signed)
No response from pt in regards to cardiac rehab. Closed referral 

## 2024-09-03 ENCOUNTER — Ambulatory Visit: Admitting: Physician Assistant

## 2024-09-03 ENCOUNTER — Ambulatory Visit: Payer: Self-pay | Admitting: Physician Assistant

## 2024-09-03 ENCOUNTER — Ambulatory Visit: Admit: 2024-09-03 | Discharge: 2024-09-03 | Disposition: A | Attending: Cardiology

## 2024-09-03 VITALS — BP 106/58 | HR 77 | Ht 63.0 in | Wt 120.8 lb

## 2024-09-03 DIAGNOSIS — Z952 Presence of prosthetic heart valve: Secondary | ICD-10-CM | POA: Insufficient documentation

## 2024-09-03 DIAGNOSIS — E782 Mixed hyperlipidemia: Secondary | ICD-10-CM | POA: Insufficient documentation

## 2024-09-03 DIAGNOSIS — I5032 Chronic diastolic (congestive) heart failure: Secondary | ICD-10-CM | POA: Diagnosis not present

## 2024-09-03 DIAGNOSIS — I350 Nonrheumatic aortic (valve) stenosis: Secondary | ICD-10-CM

## 2024-09-03 LAB — ECHOCARDIOGRAM COMPLETE
AR max vel: 1.23 cm2
AV Area VTI: 1.21 cm2
AV Area mean vel: 1.21 cm2
AV Mean grad: 13 mmHg
AV Peak grad: 23.6 mmHg
Ao pk vel: 2.43 m/s
Area-P 1/2: 3.09 cm2

## 2024-09-03 NOTE — Patient Instructions (Signed)
 Medication Instructions:  Your physician recommends that you continue on your current medications as directed. Please refer to the Current Medication list given to you today.  *If you need a refill on your cardiac medications before your next appointment, please call your pharmacy*  Lab Work: None needed If you have labs (blood work) drawn today and your tests are completely normal, you will receive your results only by: MyChart Message (if you have MyChart) OR A paper copy in the mail If you have any lab test that is abnormal or we need to change your treatment, we will call you to review the results.  Testing/Procedures: 07/08/25 Your physician has requested that you have an echocardiogram. Echocardiography is a painless test that uses sound waves to create images of your heart. It provides your doctor with information about the size and shape of your heart and how well your hearts chambers and valves are working. This procedure takes approximately one hour. There are no restrictions for this procedure. Please do NOT wear cologne, perfume, aftershave, or lotions (deodorant is allowed). Please arrive 15 minutes prior to your appointment time.  Please note: We ask at that you not bring children with you during ultrasound (echo/ vascular) testing. Due to room size and safety concerns, children are not allowed in the ultrasound rooms during exams. Our front office staff cannot provide observation of children in our lobby area while testing is being conducted. An adult accompanying a patient to their appointment will only be allowed in the ultrasound room at the discretion of the ultrasound technician under special circumstances. We apologize for any inconvenience.   Follow-Up: At [facility], you and your health needs are our priority.  As part of our continuing mission to provide you with exceptional heart care, our providers are all part of one team.  This team includes your primary  Cardiologist (physician) and Advanced Practice Providers or APPs (Physician Assistants and Nurse Practitioners) who all work together to provide you with the care you need, when you need it.  Your next appointment:   As scheduled on 07/08/25  Provider:   Izetta Hummer, PA-C  We recommend signing up for the patient portal called MyChart.  Sign up information is provided on this After Visit Summary.  MyChart is used to connect with patients for Virtual Visits (Telemedicine).  Patients are able to view lab/test results, encounter notes, upcoming appointments, etc.  Non-urgent messages can be sent to your provider as well.   To learn more about what you can do with MyChart, go to [email or web address].

## 2024-09-03 NOTE — Progress Notes (Signed)
 [facility]   MULTIDISCIPLINARY HEART VALVE CLINIC                                     Cardiology Office Note:    Date:  09/03/2024   ID:  Kayla Ward, DOB 01/08/1946, MRN [id number]  PCP:  Anita Bernardino BROCKS, FNP  [facility] Cardiologist:  Newman JINNY Lawrence, MD  [facility] Structural heart: Lurena MARLA Red, MD [facility] Electrophysiologist:  None   Referring MD: Anita Bernardino BROCKS, FNP   1 month s/p TAVR  History of Present Illness:    Kayla Ward is a 78 y.o. 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  Echo 05/14/24 showed EF 60%, moderate TR, and mod-severe AI/AS with a mean grad 32 mmHg, Vmax 3.57 m/s, AVA 0.66 cm2, DVI 0.23, SVI 38. She noted worsening shortness of breath with exertion and fatigue over the last couple years. She also reported chest tightness with exertion such as walking uphill. She also has palpitations and had a vasovagal syncopal episode in January in which she hit her head and fractured her neck. She underwent coronary angiography 06/05/24 w/ no significant obstructive disease, borderline pulmonary hypertension (WHO Grp II) and mildly elevated filling pressures. S/p successful TAVR with a 23 mm Edwards Sapien 3 Ultra Resilia THV via the TF approach on 07/17/24. Post op echo showed EF 60%, moderate TR, and normally functioning TAVR with a mean gradient of 9 mmHg and no PVL. Started on a baby Asprin 81mg  daily and discharged on POD 1. At post hospital follow up she had a knot in her left groin that was painful. Follow up doppler showed no PSA but did note swollen lymph nodes.   Today the patient presents to clinic for follow up. Here alone. Doing great. No CP or SOB. No LE edema, orthopnea or PND. Only gets dizziness with ocular migraines. No syncope. No  blood in stool or urine. No palpitations. Groin pain totally resolved.    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 Red and Dr. Daniel   Seasonal allergies      Current Medications: Current Meds  Medication Sig   acetaminophen  (TYLENOL ) 500 MG tablet Take 500-1,000 mg by mouth every 6 (six) hours as needed.   amoxicillin  (AMOXIL ) 500 MG tablet Take 4 tablets (2,000 mg total) by mouth as directed. 1 hour prior to dental work including cleanings   Artificial Saliva (ACT DRY MOUTH) LOZG Use as directed 1 lozenge in the mouth or throat 2 (two) times daily as needed (dry mouth).   Ascorbic Acid  (VITAMIN C  PO) Take 500 mg by mouth 2 (two) times daily.   aspirin  81 MG chewable tablet Chew 1 tablet (81 mg total) by mouth daily.   BLACK ELDERBERRY PO Take 50 mg by mouth in the morning. Chewable   Calcium -Vitamin D -Vitamin K  650-12.5-40 MG-MCG-MCG CHEW Chew 1 tablet by mouth 2 (two) times daily. Viactiv   carboxymethylcellulose (REFRESH PLUS) 0.5 % SOLN Place  1 drop into both eyes in the morning.   cetirizine (ZYRTEC) 10 MG tablet Take 10 mg by mouth daily as needed for allergies.   diclofenac  sodium (VOLTAREN ) 1 % GEL Apply 2 g topically 3 (three) times daily as needed (knee pain).   famotidine  (PEPCID ) 10 MG tablet Take 10 mg by mouth 2 (two) times daily as needed for heartburn or indigestion.   Multiple Vitamins-Minerals (WOMENS MULTIVITAMIN GUMMIES) CHEW Chew 2 tablets by mouth in the morning. Alive   rosuvastatin  (CRESTOR ) 10 MG tablet TAKE 1 TABLET BY MOUTH DAILY   sodium chloride  (OCEAN) 0.65 % SOLN nasal spray Place 1 spray into both nostrils in the morning.   SUMAtriptan (IMITREX) 50 MG tablet Take 50 mg by mouth daily as needed for migraine.      ROS:   Please see the  history of present illness.    All other systems reviewed and are negative.  EKGs       Risk Assessment/Calculations:           Physical Exam:    VS:  BP (!) 106/58   Pulse 77   Ht 5' 3 (1.6 m)   Wt 120 lb 12.8 oz (54.8 kg)   SpO2 96%   BMI 21.40 kg/m     Wt Readings from Last 3 Encounters:  09/03/24 120 lb 12.8 oz (54.8 kg)  07/23/24 118 lb 12.8 oz (53.9 kg)  07/18/24 116 lb 8 oz (52.8 kg)     GEN: appears younger than her stated age. NECK: No JVD CARDIAC: RRR, no murmurs, rubs, gallops RESPIRATORY:  Clear to auscultation without rales, wheezing or rhonchi  ABDOMEN: Soft, non-tender, non-distended EXTREMITIES:  No edema; No deformity.  ASSESSMENT:    1. S/P TAVR (transcatheter aortic valve replacement)   2. Chronic heart failure with preserved ejection fraction (HFpEF) (HCC)   3. Mixed hyperlipidemia     PLAN:    In order of problems listed above:  Severe AS s/p TAVR:  -- Echo today shows EF 65-70%, mild MR, normally functioning TAVR with a mean gradient of 13 mm hg and no PVL.  -- NYHA class I.  -- SBE prophylaxis discussed; she has RX'd amoxicillin .   -- Continue Aspirin  81mg  daily. -- I will see back for 1 year echo and OV.  HFpEF: -- She appears euvolemic off diuretics.    HLD: -- Continue Crestor  10mg  daily.    Medication Adjustments/Labs and Tests Ordered: Current medicines are reviewed at length with the patient today.  Concerns regarding medicines are outlined above.  Orders Placed This Encounter  Procedures   ECHOCARDIOGRAM COMPLETE   No orders of the defined types were placed in this encounter.   Patient Instructions  Medication Instructions:  Your physician recommends that you continue on your current medications as directed. Please refer to the Current Medication list given to you today.  *If you need a refill on your cardiac medications before your next appointment, please call your pharmacy*  Lab Work: None needed If you  have labs (blood work) drawn today and your tests are completely normal, you will receive your results only by: MyChart Message (if you have MyChart) OR A paper copy in the mail If you have any lab test that is abnormal or we need to change your treatment, we will call you to review the results.  Testing/Procedures: 07/08/25 Your physician has requested that you have an echocardiogram. Echocardiography is a painless test that uses sound waves to create images of  your heart. It provides your doctor with information about the size and shape of your heart and how well your hearts chambers and valves are working. This procedure takes approximately one hour. There are no restrictions for this procedure. Please do NOT wear cologne, perfume, aftershave, or lotions (deodorant is allowed). Please arrive 15 minutes prior to your appointment time.  Please note: We ask at that you not bring children with you during ultrasound (echo/ vascular) testing. Due to room size and safety concerns, children are not allowed in the ultrasound rooms during exams. Our front office staff cannot provide observation of children in our lobby area while testing is being conducted. An adult accompanying a patient to their appointment will only be allowed in the ultrasound room at the discretion of the ultrasound technician under special circumstances. We apologize for any inconvenience.   Follow-Up: At [facility], you and your health needs are our priority.  As part of our continuing mission to provide you with exceptional heart care, our providers are all part of one team.  This team includes your primary Cardiologist (physician) and Advanced Practice Providers or APPs (Physician Assistants and Nurse Practitioners) who all work together to provide you with the care you need, when you need it.  Your next appointment:   As scheduled on 07/08/25  Provider:   Izetta Hummer, PA-C  We recommend signing up for the  patient portal called MyChart.  Sign up information is provided on this After Visit Summary.  MyChart is used to connect with patients for Virtual Visits (Telemedicine).  Patients are able to view lab/test results, encounter notes, upcoming appointments, etc.  Non-urgent messages can be sent to your provider as well.   To learn more about what you can do with MyChart, go to [email or web address].          Signed, Lamarr Hummer, PA-C  09/03/2024 3:18 PM    [facility]

## 2024-11-22 ENCOUNTER — Ambulatory Visit: Admitting: Cardiology

## 2025-07-08 ENCOUNTER — Ambulatory Visit: Admitting: Physician Assistant

## 2025-07-08 ENCOUNTER — Ambulatory Visit (HOSPITAL_COMMUNITY)
